# Patient Record
Sex: Male | Born: 1970 | Race: White | Hispanic: No | Marital: Married | Smoking: Never smoker
Health system: Southern US, Community
[De-identification: ages and names within clinical notes are randomized; demographics above are authoritative.]

## PROBLEM LIST (undated history)

## (undated) DIAGNOSIS — J40 Bronchitis, not specified as acute or chronic: Secondary | ICD-10-CM

## (undated) DIAGNOSIS — K219 Gastro-esophageal reflux disease without esophagitis: Secondary | ICD-10-CM

## (undated) HISTORY — DX: Gastro-esophageal reflux disease without esophagitis: K21.9

## (undated) HISTORY — PX: NO PAST SURGERIES: SHX2092

---

## 2003-10-29 ENCOUNTER — Emergency Department (HOSPITAL_COMMUNITY): Admission: EM | Admit: 2003-10-29 | Discharge: 2003-10-29 | Payer: Self-pay | Admitting: Emergency Medicine

## 2003-11-06 ENCOUNTER — Inpatient Hospital Stay (HOSPITAL_COMMUNITY): Admission: RE | Admit: 2003-11-06 | Discharge: 2003-11-07 | Payer: Self-pay | Admitting: Psychiatry

## 2010-12-07 ENCOUNTER — Emergency Department (HOSPITAL_COMMUNITY)
Admission: EM | Admit: 2010-12-07 | Discharge: 2010-12-07 | Disposition: A | Discharge status: Home / Self Care | Payer: Self-pay | Attending: Emergency Medicine | Admitting: Emergency Medicine

## 2010-12-07 DIAGNOSIS — M5430 Sciatica, unspecified side: Secondary | ICD-10-CM | POA: Insufficient documentation

## 2010-12-07 DIAGNOSIS — R209 Unspecified disturbances of skin sensation: Secondary | ICD-10-CM | POA: Insufficient documentation

## 2010-12-07 HISTORY — DX: Bronchitis, not specified as acute or chronic: J40

## 2010-12-07 MED ORDER — OXYCODONE-ACETAMINOPHEN 5-325 MG PO TABS
2.0000 | ORAL_TABLET | Freq: Once | ORAL | Status: AC
  Administered 2010-12-07: 2 via ORAL
  Filled 2010-12-07: qty 2

## 2010-12-07 MED ORDER — OXYCODONE-ACETAMINOPHEN 5-325 MG PO TABS: 1.0000 | ORAL_TABLET | ORAL | Status: AC | PRN

## 2010-12-07 MED ORDER — PREDNISONE 10 MG PO TABS: ORAL_TABLET | ORAL | Status: AC

## 2010-12-07 MED ORDER — CYCLOBENZAPRINE HCL 10 MG PO TABS: 10.0000 mg | ORAL_TABLET | Freq: Three times a day (TID) | ORAL | Status: AC | PRN

## 2010-12-07 NOTE — ED Provider Notes (Signed)
History     CSN: [id number] Arrival date & time: 12/07/2010  5:42 PM  Chief Complaint  Patient presents with  . Back Pain  . Back Injury   HPI Comments: Patient c/o low back pain and intermittent numbness and tingling into his right leg with excessive standing, that improves with rest.  States the sx's began after he was lifting heavy furniture several days ago.  He denies incontinence of feces or urine, abd pain, perineal numbness or weakness of his legs.   Patient is a 40 y.o. male presenting with back pain. The history is provided by the patient.  Back Pain  This is a new problem. The current episode started more than 2 days ago. The problem occurs constantly. The problem has not changed since onset.The pain is associated with lifting heavy objects. The pain is present in the lumbar spine. The quality of the pain is described as stabbing and aching. The pain radiates to the right thigh. The pain is moderate. The symptoms are aggravated by bending, twisting and certain positions. The pain is the same all the time. Associated symptoms include numbness, leg pain and tingling. Pertinent negatives include no chest pain, no fever, no abdominal pain, no abdominal swelling, no bowel incontinence, no bladder incontinence, no dysuria, no pelvic pain and no weakness. He has tried nothing for the symptoms.    Past Medical History  Diagnosis Date  . Bronchitis     History reviewed. No pertinent past surgical history.  History reviewed. No pertinent family history.  History  Substance Use Topics  . Smoking status: Never Smoker   . Smokeless tobacco: Not on file  . Alcohol Use: No      Review of Systems  Constitutional: Negative for fever.  HENT: Negative for neck pain and neck stiffness.   Cardiovascular: Negative for chest pain.  Gastrointestinal: Negative for abdominal pain and bowel incontinence.  Genitourinary: Negative for bladder incontinence, dysuria and pelvic pain.    Musculoskeletal: Positive for back pain. Negative for joint swelling.  Skin: Negative.   Neurological: Positive for tingling and numbness. Negative for weakness.  All other systems reviewed and are negative.    Physical Exam  BP 146/97  Pulse 110  Temp(Src) 98.6 F (37 C) (Oral)  Resp 20  Ht 5\' 10"  (1.778 m)  Wt 245 lb (111.131 kg)  BMI 35.15 kg/m2  SpO2 99%  Physical Exam  Nursing note and vitals reviewed. Constitutional: He is oriented to person, place, and time. He appears well-developed and well-nourished. No distress.  HENT:  Head: Normocephalic and atraumatic.  Mouth/Throat: Oropharynx is clear and moist.  Neck: Normal range of motion. Neck supple.  Cardiovascular: Normal rate, regular rhythm and normal heart sounds.   Abdominal: Soft. There is no tenderness.  Musculoskeletal: He exhibits tenderness. He exhibits no edema.       Lumbar back: He exhibits tenderness and bony tenderness. He exhibits no edema, no deformity and normal pulse.  Lymphadenopathy:    He has no cervical adenopathy.  Neurological: He is alert and oriented to person, place, and time. He has normal strength. No cranial nerve deficit or sensory deficit. He exhibits normal muscle tone. Coordination normal.  Reflex Scores:      Patellar reflexes are 2+ on the right side and 2+ on the left side.      Achilles reflexes are 2+ on the right side and 2+ on the left side. Skin: Skin is warm and dry.    ED Course  Procedures  MDM   1820  ttp of the right Si joint space and lumbar paraspinal muscles.  No focal neuro deficits.  DTR's nml.  Pt is ambulatory with slightly antalgic gait.  Pt agrees to close f/u with PMD.  Requests referral.  Likely sciatica.   I have reviewed the pt's vitals and nursing notes.     Patient / Family / Caregiver understand and agree with initial ED impression and plan with expectations set for ED visit.   The patient appears reasonably screened and/or stabilized for  discharge and I doubt any other medical condition or other [facility] requiring further screening, evaluation, or treatment in the ED at this time prior to discharge.   OUTPATIENT MEDICATIONS PRESCRIBED FROM THE ED:  Patient's Medications  New Prescriptions   CYCLOBENZAPRINE (FLEXERIL) 10 MG TABLET    Take 1 tablet (10 mg total) by mouth 3 (three) times daily as needed for muscle spasms.   OXYCODONE-ACETAMINOPHEN (PERCOCET) 5-325 MG PER TABLET    Take 1 tablet by mouth every 4 (four) hours as needed for pain.   PREDNISONE (DELTASONE) 10 MG TABLET    Take 6 tablets day one, 5 tablets day two, 4 tablets day three, 3 tablets day four, 2 tablets day five, then 1 tablet day six  Previous Medications   IBUPROFEN (ADVIL,MOTRIN) 200 MG TABLET    Take 400-600 mg by mouth 4 (four) times daily as needed. For pain   Modified Medications   No medications on file  Discontinued Medications   No medications on file         Marqueze Ramcharan L. [place], [place] 12/15/10 1238

## 2010-12-07 NOTE — ED Notes (Signed)
Pt reports hurting his back while lifting some furniture last Tuesday.  Pt reports pain in the mid-right side of his back.  Pt reports his rt leg going numb after standing for a few mins.

## 2010-12-07 NOTE — ED Notes (Signed)
MD at bedside., PA in prior to RN see PA assessment

## 2010-12-22 NOTE — ED Provider Notes (Signed)
Medical screening examination/treatment/procedure(s) were performed by non-physician practitioner and as supervising physician I was immediately available for consultation/collaboration.   Juliet Rude. Rubin Payor, MD 12/22/10 [phone]

## 2011-09-05 ENCOUNTER — Encounter (HOSPITAL_COMMUNITY): Payer: Self-pay | Admitting: *Deleted

## 2011-09-05 ENCOUNTER — Emergency Department (HOSPITAL_COMMUNITY)
Admission: EM | Admit: 2011-09-05 | Discharge: 2011-09-05 | Disposition: A | Discharge status: Home / Self Care | Payer: Self-pay | Attending: Emergency Medicine | Admitting: Emergency Medicine

## 2011-09-05 DIAGNOSIS — H6691 Otitis media, unspecified, right ear: Secondary | ICD-10-CM

## 2011-09-05 DIAGNOSIS — H6690 Otitis media, unspecified, unspecified ear: Secondary | ICD-10-CM | POA: Insufficient documentation

## 2011-09-05 DIAGNOSIS — H9209 Otalgia, unspecified ear: Secondary | ICD-10-CM | POA: Insufficient documentation

## 2011-09-05 DIAGNOSIS — H6121 Impacted cerumen, right ear: Secondary | ICD-10-CM

## 2011-09-05 DIAGNOSIS — H6120 Impacted cerumen, unspecified ear: Secondary | ICD-10-CM | POA: Insufficient documentation

## 2011-09-05 MED ORDER — AMOXICILLIN 500 MG PO CAPS: 500.0000 mg | ORAL_CAPSULE | Freq: Three times a day (TID) | ORAL | Status: AC

## 2011-09-05 NOTE — ED Notes (Signed)
Right ear pain and stopped up x 3 wks.

## 2011-09-05 NOTE — ED Notes (Signed)
Pt states ear has been stopped up and hurting x 3 weeks. Pt states he has tried OTC ear wax removal and this causes pain to increase.

## 2011-09-05 NOTE — ED Provider Notes (Signed)
History     CSN: [id number]  Arrival date & time 09/05/11  0917   First MD Initiated Contact with Patient 09/05/11 1038      Chief Complaint  Patient presents with  . Otalgia    (Consider location/radiation/quality/duration/timing/severity/associated sxs/prior treatment) Patient is a 41 y.o. male presenting with ear pain. The history is provided by the patient. No language interpreter was used.  Otalgia This is a new problem. Episode onset: hearing loss R ear.  has been using wax removal kit for  nearly 3 weeks and is now sore.   There is pain in the right ear. The problem occurs constantly. The problem has been gradually worsening. There has been no fever. Associated symptoms include hearing loss. Pertinent negatives include no ear discharge, no rhinorrhea, no sore throat and no neck pain. His past medical history does not include chronic ear infection, hearing loss or tympanostomy tube.    Past Medical History  Diagnosis Date  . Bronchitis     History reviewed. No pertinent past surgical history.  No family history on file.  History  Substance Use Topics  . Smoking status: Never Smoker   . Smokeless tobacco: Not on file  . Alcohol Use: Yes     occasional      Review of Systems  Constitutional: Negative for fever.  HENT: Positive for hearing loss and ear pain. Negative for sore throat, rhinorrhea, neck pain and ear discharge.   All other systems reviewed and are negative.    Allergies  Review of patient's allergies indicates no known allergies.  Home Medications   Current Outpatient Rx  Name Route Sig Dispense Refill  . CARBAMIDE PEROXIDE 6.5 % OT SOLN Right Ear Place 5 drops into the right ear as needed. For ear pain    . IBUPROFEN 200 MG PO TABS Oral Take 400-600 mg by mouth 4 (four) times daily as needed. For pain     . AMOXICILLIN 500 MG PO CAPS Oral Take 1 capsule (500 mg total) by mouth 3 (three) times daily. 30 capsule 0    BP 151/90  Pulse 81   Temp(Src) 98.1 F (36.7 C) (Oral)  Resp 16  Ht 5\' 9"  (1.753 m)  Wt 240 lb (108.863 kg)  BMI 35.44 kg/m2  SpO2 99%  Physical Exam  Nursing note and vitals reviewed. Constitutional: He is oriented to person, place, and time. He appears well-developed and well-nourished.  HENT:  Head: Normocephalic and atraumatic.  Right Ear: External ear normal. Tympanic membrane is not injected, not perforated, not erythematous, not retracted and not bulging. No hemotympanum.  Left Ear: Hearing, tympanic membrane, external ear and ear canal normal.       Cerumen impaction evident in R ear.  RN irrigated several times with water.  i irrigated with peroxide and large chunk of wax removed.  Pt hearing better.  There does however appear to be a small amount of fluid behind the TM.  There is not TM redness or bulging.  Eyes: EOM are normal.  Neck: Normal range of motion.  Cardiovascular: Normal rate, regular rhythm, normal heart sounds and intact distal pulses.   Pulmonary/Chest: Effort normal and breath sounds normal. No respiratory distress.  Abdominal: Soft. He exhibits no distension. There is no tenderness.  Musculoskeletal: Normal range of motion.  Neurological: He is alert and oriented to person, place, and time.  Skin: Skin is warm and dry.  Psychiatric: He has a normal mood and affect. Judgment normal.    ED  Course  Procedures (including critical care time)  Labs Reviewed - No data to display No results found.   1. Cerumen impaction, right   2. Otitis media of right ear       MDM  rx amoxicillin 500 mg TID, 30 Stop ceruminex tx.   F/u with [facility]        Worthy Rancher, PA 09/05/11 1137

## 2011-09-05 NOTE — Discharge Instructions (Signed)
Otitis Media, Adult A middle ear infection is an infection in the space behind the eardrum. The medical name for this is "otitis media." It may happen after a common cold. It is caused by a germ that starts growing in that space. You may feel swollen glands in your neck on the side of the ear infection. HOME CARE INSTRUCTIONS   Take your medicine as directed until it is gone, even if you feel better after the first few days.   Only take over-the-counter or prescription medicines for pain, discomfort, or fever as directed by your caregiver.   Occasional use of a nasal decongestant a couple times per day may help with discomfort and help the eustachian tube to drain better.  Follow up with your caregiver in 10 to 14 days or as directed, to be certain that the infection has cleared. Not keeping the appointment could result in a chronic or permanent injury, pain, hearing loss and disability. If there is any problem keeping the appointment, you must call back to this facility for assistance. SEEK IMMEDIATE MEDICAL CARE IF:   You are not getting better in 2 to 3 days.   You have pain that is not controlled with medication.   You feel worse instead of better.   You cannot use the medication as directed.   You develop swelling, redness or pain around the ear or stiffness in your neck.  MAKE SURE YOU:   Understand these instructions.   Will watch your condition.   Will get help right away if you are not doing well or get worse.  Document Released: 01/08/2004 Document Revised: 03/24/2011 Document Reviewed: 11/09/2007 [facility] Patient Information 2012 [place], [place].Cerumen Impaction A cerumen impaction is when the wax in your ear forms a plug. This plug usually causes reduced hearing. Sometimes it also causes an earache or dizziness. Removing a cerumen impaction can be difficult and painful. The wax sticks to the ear canal. The canal is sensitive and bleeds easily. If you try to remove a heavy  wax buildup with a cotton tipped swab, you may push it in further. Irrigation with water, suction, and small ear curettes may be used to clear out the wax. If the impaction is fixed to the skin in the ear canal, ear drops may be needed for a few days to loosen the wax. People who build up a lot of wax frequently can use ear wax removal products available in your local drugstore. SEEK MEDICAL CARE IF:  You develop an earache, increased hearing loss, or marked dizziness. Document Released: 05/12/2004 Document Revised: 03/24/2011 Document Reviewed: 07/02/2009 [facility] Patient Information 2012 [place], [place].   You had a cerumen impaction.  To cover the possibility that you have a middle ear infection i have prescribed amoxicillin.  Follow up with the ENT referral as needed.

## 2011-09-07 NOTE — ED Provider Notes (Signed)
Medical screening examination/treatment/procedure(s) were performed by non-physician practitioner and as supervising physician I was immediately available for consultation/collaboration.  Donnetta Hutching, MD 09/07/11 [phone]

## 2013-10-30 ENCOUNTER — Emergency Department (HOSPITAL_COMMUNITY)
Admission: EM | Admit: 2013-10-30 | Discharge: 2013-10-30 | Disposition: A | Discharge status: Home / Self Care | Payer: Private Health Insurance - Indemnity | Attending: Emergency Medicine | Admitting: Emergency Medicine

## 2013-10-30 ENCOUNTER — Emergency Department (HOSPITAL_COMMUNITY): Payer: Private Health Insurance - Indemnity

## 2013-10-30 ENCOUNTER — Encounter (HOSPITAL_COMMUNITY): Payer: Self-pay | Admitting: Emergency Medicine

## 2013-10-30 DIAGNOSIS — Z791 Long term (current) use of non-steroidal anti-inflammatories (NSAID): Secondary | ICD-10-CM | POA: Insufficient documentation

## 2013-10-30 DIAGNOSIS — Z8709 Personal history of other diseases of the respiratory system: Secondary | ICD-10-CM | POA: Insufficient documentation

## 2013-10-30 DIAGNOSIS — M792 Neuralgia and neuritis, unspecified: Secondary | ICD-10-CM

## 2013-10-30 DIAGNOSIS — M5412 Radiculopathy, cervical region: Secondary | ICD-10-CM | POA: Insufficient documentation

## 2013-10-30 DIAGNOSIS — M542 Cervicalgia: Secondary | ICD-10-CM

## 2013-10-30 MED ORDER — DIAZEPAM 5 MG PO TABS
5.0000 mg | ORAL_TABLET | Freq: Once | ORAL | Status: AC
  Administered 2013-10-30: 5 mg via ORAL
  Filled 2013-10-30: qty 1

## 2013-10-30 MED ORDER — OXYCODONE-ACETAMINOPHEN 5-325 MG PO TABS: 1.0000 | ORAL_TABLET | ORAL | Status: DC | PRN

## 2013-10-30 NOTE — ED Provider Notes (Signed)
CSN: [id number]     Arrival date & time 10/30/13  1910 History   First MD Initiated Contact with Patient 10/30/13 2033     Chief Complaint  Patient presents with  . Neck Pain     (Consider location/radiation/quality/duration/timing/severity/associated sxs/prior Treatment) The history is provided by the patient.   Rodderick Holtzer Starzyk is a 43 y.o. male who presents to the ED with neck pain. The pain started about 2 weeks ago when he turned his head and had immediate pain. He thought he pulled a muscle but the symptoms have gotten worse since then. The pain increases with any movement. He has taken ibuprofen without relief. He has tried heat and muscle cream. Now he is concerned due to the pain radiating down his left arm. He has been unable to seep at night due to the pain.   Past Medical History  Diagnosis Date  . Bronchitis    History reviewed. No pertinent past surgical history. No family history on file. History  Substance Use Topics  . Smoking status: Never Smoker   . Smokeless tobacco: Not on file  . Alcohol Use: Yes     Comment: occasional    Review of Systems Negative except as stated in HPI    Allergies  Review of patient's allergies indicates no known allergies.  Home Medications   Prior to Admission medications   Medication Sig Start Date End Date Taking? Authorizing Provider  carbamide peroxide (DEBROX) 6.5 % otic solution Place 5 drops into the right ear as needed. For ear pain    Historical Provider, MD  ibuprofen (ADVIL,MOTRIN) 200 MG tablet Take 400-600 mg by mouth 4 (four) times daily as needed. For pain     Historical Provider, MD   BP 168/92  Pulse 86  Temp(Src) 99 F (37.2 C) (Oral)  Resp 18  Ht 5\' 10"  (1.778 m)  Wt 250 lb (113.399 kg)  BMI 35.87 kg/m2  SpO2 99% Physical Exam  Nursing note and vitals reviewed. Constitutional: He is oriented to person, place, and time. He appears well-developed and well-nourished. No distress.  HENT:  Head:  Normocephalic and atraumatic.  Eyes: EOM are normal. Pupils are equal, round, and reactive to light.  Neck: Normal range of motion. Neck supple.  Cardiovascular: Normal rate and regular rhythm.   Pulmonary/Chest: Effort normal. No respiratory distress.  Abdominal: Soft. Bowel sounds are normal. There is no tenderness.  Musculoskeletal:       Cervical back: He exhibits decreased range of motion (due to pain), tenderness and spasm. He exhibits normal pulse.  Radial pulses equal, grips equal, adequate circulation.   Neurological: He is alert and oriented to person, place, and time. He has normal strength. No cranial nerve deficit or sensory deficit.  Reflex Scores:      Bicep reflexes are 2+ on the right side and 2+ on the left side.      Brachioradialis reflexes are 2+ on the right side and 2+ on the left side.      Patellar reflexes are 2+ on the right side and 2+ on the left side.      Achilles reflexes are 2+ on the right side and 2+ on the left side. Skin: Skin is warm and dry.  Psychiatric: He has a normal mood and affect. His behavior is normal.    ED Course  Procedures Ct Cervical Spine Wo Contrast  10/30/2013   CLINICAL DATA:  Left-sided neck and arm pain for 2 weeks with no trauma  EXAM: CT CERVICAL SPINE WITHOUT CONTRAST  TECHNIQUE: Multidetector CT imaging of the cervical spine was performed without intravenous contrast. Multiplanar CT image reconstructions were also generated.  COMPARISON:  None.  FINDINGS: Minimal C5-6 degenerative disc disease. Mildly reversed lordosis upper cervical spine likely not of acute origin. No prevertebral soft tissue swelling. Normal anterior-posterior alignment. No fracture or other focal osseous abnormality. No paraspinous soft tissue abnormality. Body habitus creates beam attenuation artifact involving the lower half of the cervical spine.  IMPRESSION: No acute findings. If there is concern for neural impingement or disc bulge, consider MRI.    Electronically Signed   By: Skipper Cliche M.D.   On: 10/30/2013 21:53    MDM  43 y.o. male with cervical spine pain that radiates to the left arm x 2 weeks. Symptoms have worsened and he is now unable to sleep due to the pain. Discussed with Dr. Lacinda Axon and MR of C spine ordered for tomorrow. I discussed with the patient clinical and CT findings and plan of care and he agrees with plan. Stable for discharge.    Medication List    TAKE these medications       oxyCODONE-acetaminophen 5-325 MG per tablet  Commonly known as:  PERCOCET/ROXICET  Take 1-2 tablets by mouth every 4 (four) hours as needed for moderate pain or severe pain.      ASK your doctor about these medications       ibuprofen 200 MG tablet  Commonly known as:  ADVIL,MOTRIN  Take 400-600 mg by mouth 4 (four) times daily as needed. For pain          Ashley Murrain, NP 10/30/13 2223

## 2013-10-30 NOTE — Discharge Instructions (Signed)
Your CT scan did not show any abnormality; however, because of the pain that radiates from your neck to your arm and causes numbness and tingling in your fingers we are ordering an MRI of your cervical spine.

## 2013-10-30 NOTE — ED Notes (Signed)
Patient states he turned his head and began having neck pain about 2 weeks ago; patient states pain is not getting any better.  Patient states pain radiates down his left arm.

## 2013-10-31 ENCOUNTER — Ambulatory Visit (HOSPITAL_COMMUNITY): Admit: 2013-10-31 | Payer: PRIVATE HEALTH INSURANCE

## 2013-10-31 ENCOUNTER — Ambulatory Visit (HOSPITAL_COMMUNITY): Payer: Managed Care, Other (non HMO) | Attending: Emergency Medicine

## 2013-10-31 NOTE — ED Provider Notes (Signed)
Medical screening examination/treatment/procedure(s) were performed by non-physician practitioner and as supervising physician I was immediately available for consultation/collaboration.   EKG Interpretation None       Nat Christen, MD 10/31/13 1914

## 2013-11-01 ENCOUNTER — Other Ambulatory Visit (HOSPITAL_COMMUNITY): Payer: PRIVATE HEALTH INSURANCE

## 2013-11-04 MED FILL — Oxycodone w/ Acetaminophen Tab 5-325 MG: ORAL | Qty: 6 | Status: AC

## 2021-05-17 ENCOUNTER — Telehealth: Payer: Self-pay

## 2021-05-17 NOTE — Telephone Encounter (Signed)
Sounds appropriate. Ok to schedule, looks like there is availability in [place] clinic tomorrow.

## 2021-05-17 NOTE — Telephone Encounter (Signed)
OUD Intake:  Drug of choice: "Cocaine and heroin, and I've done meth, too."  How long: "Since I was 52 years old."  Other substances: "I've smoked marijuana."  Currently on suboxone: "Yes, I've been taking off the street because I didn't have insurance" (1 film daily)  Interested in treatment: "Yes"  Around users.: "No"  Social support: "Yes, my Pastor. My church has NA and AA meetings every Friday, and I've been trying to go to those."   Other: "I got out of prison in April of last year and I'm trying to stay straight, and do things the right way, I just need some help."  Will forward to OUD Attending Pool for consideration of OUD appt. Hubbard Hartshorn, BSN, RN-BC

## 2021-05-17 NOTE — Telephone Encounter (Signed)
Patient scheduled for first OUD appt tomorrow at [place]. He was given detailed instructions on where [facility] is located. He is very [employer] attorney.

## 2021-05-17 NOTE — Telephone Encounter (Signed)
Requesting an appt for new pt OUD, please call pt back @ 11:00 or 2:00pm that's when he take his lunch break.

## 2021-05-18 ENCOUNTER — Ambulatory Visit (INDEPENDENT_AMBULATORY_CARE_PROVIDER_SITE_OTHER): Payer: Self-pay | Admitting: Student

## 2021-05-18 ENCOUNTER — Other Ambulatory Visit: Payer: Self-pay

## 2021-05-18 VITALS — BP 124/66 | HR 71 | Temp 98.1°F | Ht 69.0 in | Wt 233.9 lb

## 2021-05-18 DIAGNOSIS — F32A Depression, unspecified: Secondary | ICD-10-CM

## 2021-05-18 DIAGNOSIS — M545 Low back pain, unspecified: Secondary | ICD-10-CM

## 2021-05-18 DIAGNOSIS — G8929 Other chronic pain: Secondary | ICD-10-CM

## 2021-05-18 DIAGNOSIS — F1110 Opioid abuse, uncomplicated: Secondary | ICD-10-CM

## 2021-05-18 DIAGNOSIS — F419 Anxiety disorder, unspecified: Secondary | ICD-10-CM

## 2021-05-18 DIAGNOSIS — F1190 Opioid use, unspecified, uncomplicated: Secondary | ICD-10-CM

## 2021-05-18 MED ORDER — DULOXETINE HCL 30 MG PO CPEP: 30.0000 mg | ORAL_CAPSULE | Freq: Every day | ORAL | 2 refills | Status: DC

## 2021-05-18 MED ORDER — BUPRENORPHINE HCL-NALOXONE HCL 8-2 MG SL FILM: 1.0000 | ORAL_FILM | Freq: Every day | SUBLINGUAL | 0 refills | Status: DC

## 2021-05-18 NOTE — Patient Instructions (Signed)
It was a pleasure seeing you in clinic. Today we discussed:   Opoid use Please start using Suboxone 8-2 mg 1/2 film twice a day  Back pain Please stop using gabapentin Start duloxetine 30 mg daily You can take ibuprofen as needed for pain on bad days, not to take more than twice a week  Please follow up in 2 weeks  If you have any questions or concerns, please call our clinic at [phone] between 9am-5pm and after hours call [phone] and ask for the internal medicine resident on call. If you feel you are having a medical emergency please call 911.   Thank you, we look forward to helping you remain healthy!

## 2021-05-18 NOTE — Progress Notes (Signed)
05/19/2021  Edward [name] presents for buprenorphine/naloxone intake visit.   I have reviewed EPIC data including labwork which was available.  I have reviewed outside records provided by patient if available.  The salient points were confirmed with the patient.    Review of substance use history (first use, substances used, any illicit purchases):   Patient reports he first started to use marijuana at age 51 while hanging out with friends Subsequently progressed to cocaine, methamphetamines when 60, and heroin in 85s which he used off an on. He reports main route was snorting, denies any history of IV drug use. He stopped using substances after going to prison in June 2021 and released [place] 2022. Since then he has start smoking mariajuana about 3-4 times a day. Has also started using illicit suboxone and gabapentin. Started suboxone as he was worried that he may relapse on heroin.  States he had many relapses after quitting prior to being imprisoned and was never success at quitting long term. Also has been using suboxone and gabapentin to help with chronic left sided back pain. Currently used about 1 film  of suboxone daily and gabapentin 2-3 times daily. He reports he used about 1/2 a film of suboxone this morning.  Mental Health History: depression and anxiety in the past treated with xanax, prozac and another medication but he cannot recall. Has been feeling down recently due to being unable to things due to leg and back pain.  Current counseling/behavioural health provider: [facility] at [place] in the early 2000s  This patient has Opioid Use Disorder by following DSM-V criteria: Keep those that apply.  - Opioids taken in larger amounts or over a longer period than intended - Persistent desire to cut down - Cravings to use opioids - Use resulting in a failure to fulfill major role obligation - Use despite knowledge of health problems caused by opioids - Tolerance - Withdrawal  Past  Medical History:  Diagnosis Date   Bronchitis     No current outpatient medications on file prior to visit.   No current facility-administered medications on file prior to visit.   Physical Exam  Vitals:   05/18/21 0846 05/18/21 0847  BP:  124/66  Pulse:  71  Temp:  98.1 F (36.7 C)  TempSrc:  Oral  SpO2:  99%  Weight: 233 lb 14.4 oz (106.1 kg)   Height: 5\' 9"  (1.753 m)    Physical Exam Constitutional:      Appearance: Normal appearance.  Cardiovascular:     Rate and Rhythm: Normal rate and regular rhythm.  Pulmonary:     Effort: Pulmonary effort is normal.     Breath sounds: Normal breath sounds.  Musculoskeletal:     Comments: TTP of the left lumbar back, lateral thigh and anterior superior lateral knee  Skin:    General: Skin is warm and dry.  Neurological:     Mental Status: He is alert.     Clinical Opiate Withdrawal Scale: bold applicable COWS scoring   - Resting HR:    - 0 for < 80   - 1 for 81 - 100   - 2 for 101 - 120   - 4 for > 120  - Sweating:   - 0 for no chills/flushing   - 1 for subjective chills/flushing   - 3 for beads of sweat on brow/face   - 4 for sweat streaming off of face  - Restlessness:    - 0 for able to  sit still   - 1 for subjective difficulty sitting still   - 3 for frequent shifting or extraneous movement   - 5 for unable to sit still for more than a few seconds  - Pupil size:    - 0 for pinpoint or normal   - 1 for possibly larger than normal   - 2 for moderately dilated   - 5 for only iris rim visible  - Bone/joint pain:    - 0 for not present   - 1 for mild diffuse discomfort   - 2 severe diffuse aching   - 4 for objectively rubbing joints/muscles and obviously in pain  - Runny nose/tearing:    - 0 for not present   - 1 for stuffy nose/moist eyes   - 2 for nose running/tearing   - 4 for nose constantly running or tears streaming down cheeks  - GI Upset:    - 0 for no GI symptoms   - 1 for stomach cramps   - 2  for nausea or loose stool   - 3 for vomiting or diarrhea   - 5 for multiple episodes of vomiting or diarrhea  - Tremor observation of outstretched hands:    - 0 for no tremor   - 1 for tremor can be felt but not observed   - 2 for slight tremor observable   - 4 for gross tremor or muscle twitching  - Yawning:    - 0 for no yawning   - 1 for yawning once or twice during assessment   - 2 for yawning three or more times during assessment   - 4 for yawning several times per minute  - Anxiety or irritability:    - 0 for none   - 1 for patient reports increasing irritability or anxiousness   - 2 for patient obviously irritable/anxious   - 4 for patient so irritable/anxious that assessment is difficult  - Gooseflesh:    - 0 for skin is smooth   - 3 for piloerection of skin can be felt or seen   - 5 for prominent piloerection  TOTAL: 2  Assessment/Plan:   Based on a review of the patient's medical history including substance use and mental health factors, and physical exam, Edward [name] is a suitable candidate for MAT with buprenorphine/naloxone.  UDS ordered this visit.    I have discussed HIV and Hepatitis C screening with this patient.    I will place orders for CMET for baseline LFT evaluation if needed.    Home Induction:   I have instructed the patient how to appropriately take this medication, including placing under the tongue with head relaxed for 10 minutes and allowing to dissolve without chewing or swallowing tab/film, and with nothing to eat or drink in the subsequent 15 minutes.  They have been told not to start taking the medication until they have significant signs of withdrawal and I have explained the concept of precipitated withdrawal with the patient.    Intervisit Care:  We discussed this medication must be kept in a safe place and away from children.   We will see the patient back in 1 week in clinic, with options for a sooner appointment based on patient and  provider preference.   Patient was encouraged to call the office and speak with the MD on call for any urgent concerns.    [name], MD 05/19/2021 7:17 AM

## 2021-05-19 ENCOUNTER — Encounter: Payer: Self-pay | Admitting: Student

## 2021-05-19 DIAGNOSIS — F1190 Opioid use, unspecified, uncomplicated: Secondary | ICD-10-CM | POA: Insufficient documentation

## 2021-05-19 DIAGNOSIS — F419 Anxiety disorder, unspecified: Secondary | ICD-10-CM | POA: Insufficient documentation

## 2021-05-19 DIAGNOSIS — G8929 Other chronic pain: Secondary | ICD-10-CM | POA: Insufficient documentation

## 2021-05-19 DIAGNOSIS — M545 Low back pain, unspecified: Secondary | ICD-10-CM | POA: Insufficient documentation

## 2021-05-19 LAB — CMP14 + ANION GAP
ALT: 11 IU/L (ref 0–44)
AST: 13 IU/L (ref 0–40)
Albumin/Globulin Ratio: 1.7 (ref 1.2–2.2)
Albumin: 4.4 g/dL (ref 4.0–5.0)
Alkaline Phosphatase: 96 IU/L (ref 44–121)
Anion Gap: 14 mmol/L (ref 10.0–18.0)
BUN/Creatinine Ratio: 15 (ref 9–20)
BUN: 15 mg/dL (ref 6–24)
Bilirubin Total: 0.3 mg/dL (ref 0.0–1.2)
CO2: 23 mmol/L (ref 20–29)
Calcium: 9 mg/dL (ref 8.7–10.2)
Chloride: 102 mmol/L (ref 96–106)
Creatinine, Ser: 0.97 mg/dL (ref 0.76–1.27)
Globulin, Total: 2.6 g/dL (ref 1.5–4.5)
Glucose: 101 mg/dL — ABNORMAL HIGH (ref 70–99)
Potassium: 4.6 mmol/L (ref 3.5–5.2)
Sodium: 139 mmol/L (ref 134–144)
Total Protein: 7 g/dL (ref 6.0–8.5)
eGFR: 96 mL/min/{1.73_m2} (ref >59)

## 2021-05-19 LAB — HIV ANTIBODY (ROUTINE TESTING W REFLEX): HIV Screen 4th Generation wRfx: NONREACTIVE

## 2021-05-19 LAB — HCV INTERPRETATION

## 2021-05-19 LAB — HCV AB W REFLEX TO QUANT PCR: HCV Ab: <0.1 s/co ratio (ref 0.0–0.9)

## 2021-05-19 NOTE — Assessment & Plan Note (Signed)
Reports history of anxiety and depression. Treated with xannax, prozac, and another medication be cannot recall at [facility] in the early 2000s. States depression and panic attacks have greatly improved since then. Lately has been feeling down due to his leg pain and concern it may start limiting his daily activities. We will start duloxetine 30 mg daily for the chronic leg and back pain and this may also improve his mood as week. Would repeat PHQ9 and GAD7 and discuss at next visit.

## 2021-05-19 NOTE — Addendum Note (Signed)
Addended by: Lalla Brothers T on: 05/19/2021 08:32 AM   Modules accepted: Level of Service

## 2021-05-19 NOTE — Progress Notes (Signed)
Internal Medicine Clinic Attending  I saw and evaluated the patient.  I personally confirmed the key portions of the history and exam documented by Dr. Liang and I reviewed pertinent patient test results.  The assessment, diagnosis, and plan were formulated together and I agree with the documentation in the resident's note.  

## 2021-05-19 NOTE — Assessment & Plan Note (Signed)
Patient presents with history of heroin use off and on since his 81s. Last use was June 2021 after being incarcerated. Has been using ilicit suboxone for about 9 months due to concerns of relapsing on heroin. Denies history of of IV drug use. States he tested negative for HIV and hepC when incarcerated. Likely has mild to moderate OUD. Currently used suboxone 1/2 film twice daily. We reviewed and sign contract today.  Start suboxone 8-2mg  1/2 film twice daily Check CMP, HIV, Hep C Follow up in 2 weeks

## 2021-05-19 NOTE — Assessment & Plan Note (Signed)
Patient reports chronic left lower back pain after stumbling on a sidewalk curb in June 2022. Has been taking illicit gabapentin and suboxone for this. States pain radiates to the side of left leg to the level of the knee states leg intermittently with pin and needle sensation. No radiation to the foot. Denies fever, chills, weight loss, bowel or bladder incontinence. Pain is worse with activity, such as standing for long period at work. He works as a Curator. 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 Will start on duloxetine 30 mg daily. Discussed suboxone we are starting is not to treat his pain. He can take NSAID as needed on bad days not to use more than 2 days a week.

## 2021-05-24 ENCOUNTER — Emergency Department (HOSPITAL_COMMUNITY): Payer: 59

## 2021-05-24 ENCOUNTER — Encounter (HOSPITAL_COMMUNITY): Payer: Self-pay | Admitting: *Deleted

## 2021-05-24 ENCOUNTER — Other Ambulatory Visit: Payer: Self-pay

## 2021-05-24 ENCOUNTER — Emergency Department (HOSPITAL_COMMUNITY)
Admission: EM | Admit: 2021-05-24 | Discharge: 2021-05-24 | Disposition: A | Discharge status: Home / Self Care | Payer: 59 | Attending: Emergency Medicine | Admitting: Emergency Medicine

## 2021-05-24 ENCOUNTER — Telehealth: Payer: Self-pay | Admitting: Internal Medicine

## 2021-05-24 DIAGNOSIS — R7309 Other abnormal glucose: Secondary | ICD-10-CM | POA: Insufficient documentation

## 2021-05-24 DIAGNOSIS — N200 Calculus of kidney: Secondary | ICD-10-CM | POA: Diagnosis not present

## 2021-05-24 DIAGNOSIS — Z20822 Contact with and (suspected) exposure to covid-19: Secondary | ICD-10-CM | POA: Diagnosis not present

## 2021-05-24 DIAGNOSIS — M87052 Idiopathic aseptic necrosis of left femur: Secondary | ICD-10-CM | POA: Insufficient documentation

## 2021-05-24 DIAGNOSIS — K5790 Diverticulosis of intestine, part unspecified, without perforation or abscess without bleeding: Secondary | ICD-10-CM | POA: Insufficient documentation

## 2021-05-24 DIAGNOSIS — R1030 Lower abdominal pain, unspecified: Secondary | ICD-10-CM | POA: Insufficient documentation

## 2021-05-24 DIAGNOSIS — M87051 Idiopathic aseptic necrosis of right femur: Secondary | ICD-10-CM | POA: Insufficient documentation

## 2021-05-24 DIAGNOSIS — M87059 Idiopathic aseptic necrosis of unspecified femur: Secondary | ICD-10-CM

## 2021-05-24 DIAGNOSIS — R1110 Vomiting, unspecified: Secondary | ICD-10-CM | POA: Insufficient documentation

## 2021-05-24 LAB — URINALYSIS, ROUTINE W REFLEX MICROSCOPIC
Bilirubin Urine: NEGATIVE
Glucose, UA: NEGATIVE mg/dL
Ketones, ur: 40 mg/dL — AB
Leukocytes,Ua: NEGATIVE
Nitrite: NEGATIVE
Specific Gravity, Urine: 1.02 (ref 1.005–1.030)
pH: 7 (ref 5.0–8.0)

## 2021-05-24 LAB — COMPREHENSIVE METABOLIC PANEL
ALT: 13 U/L (ref 0–44)
AST: 11 U/L — ABNORMAL LOW (ref 15–41)
Albumin: 4.1 g/dL (ref 3.5–5.0)
Alkaline Phosphatase: 84 U/L (ref 38–126)
Anion gap: 10 (ref 5–15)
BUN: 15 mg/dL (ref 6–20)
CO2: 22 mmol/L (ref 22–32)
Calcium: 9.1 mg/dL (ref 8.9–10.3)
Chloride: 103 mmol/L (ref 98–111)
Creatinine, Ser: 0.89 mg/dL (ref 0.61–1.24)
GFR, Estimated: >60 mL/min (ref >60)
Glucose, Bld: 130 mg/dL — ABNORMAL HIGH (ref 70–99)
Potassium: 3.6 mmol/L (ref 3.5–5.1)
Sodium: 135 mmol/L (ref 135–145)
Total Bilirubin: 1.4 mg/dL — ABNORMAL HIGH (ref 0.3–1.2)
Total Protein: 8.1 g/dL (ref 6.5–8.1)

## 2021-05-24 LAB — RESP PANEL BY RT-PCR (FLU A&B, COVID) ARPGX2
Influenza A by PCR: NEGATIVE
Influenza B by PCR: NEGATIVE
SARS Coronavirus 2 by RT PCR: NEGATIVE

## 2021-05-24 LAB — CBC
HCT: 43.9 % (ref 39.0–52.0)
Hemoglobin: 14.9 g/dL (ref 13.0–17.0)
MCH: 27.2 pg (ref 26.0–34.0)
MCHC: 33.9 g/dL (ref 30.0–36.0)
MCV: 80.1 fL (ref 80.0–100.0)
Platelets: 339 10*3/uL (ref 150–400)
RBC: 5.48 MIL/uL (ref 4.22–5.81)
RDW: 13.3 % (ref 11.5–15.5)
WBC: 8.5 10*3/uL (ref 4.0–10.5)
nRBC: 0 % (ref 0.0–0.2)

## 2021-05-24 LAB — DIFFERENTIAL
Abs Immature Granulocytes: 0.03 10*3/uL (ref 0.00–0.07)
Basophils Absolute: 0 10*3/uL (ref 0.0–0.1)
Basophils Relative: 0 %
Eosinophils Absolute: 0 10*3/uL (ref 0.0–0.5)
Eosinophils Relative: 0 %
Immature Granulocytes: 0 %
Lymphocytes Relative: 23 %
Lymphs Abs: 1.9 10*3/uL (ref 0.7–4.0)
Monocytes Absolute: 0.4 10*3/uL (ref 0.1–1.0)
Monocytes Relative: 5 %
Neutro Abs: 6.2 10*3/uL (ref 1.7–7.7)
Neutrophils Relative %: 72 %

## 2021-05-24 LAB — URINALYSIS, MICROSCOPIC (REFLEX)
Bacteria, UA: NONE SEEN
Squamous Epithelial / LPF: NONE SEEN (ref 0–5)
WBC, UA: NONE SEEN WBC/hpf (ref 0–5)

## 2021-05-24 LAB — LIPASE, BLOOD: Lipase: 28 U/L (ref 11–51)

## 2021-05-24 MED ORDER — IOHEXOL 300 MG/ML  SOLN
100.0000 mL | Freq: Once | INTRAMUSCULAR | Status: AC | PRN
  Administered 2021-05-24: 100 mL via INTRAVENOUS

## 2021-05-24 MED ORDER — ONDANSETRON HCL 4 MG PO TABS: ORAL_TABLET | ORAL | 0 refills | Status: DC

## 2021-05-24 MED ORDER — ONDANSETRON HCL 4 MG/2ML IJ SOLN
4.0000 mg | Freq: Once | INTRAMUSCULAR | Status: AC
  Administered 2021-05-24: 4 mg via INTRAVENOUS
  Filled 2021-05-24: qty 2

## 2021-05-24 MED ORDER — PANTOPRAZOLE SODIUM 40 MG IV SOLR
40.0000 mg | Freq: Once | INTRAVENOUS | Status: AC
  Administered 2021-05-24: 40 mg via INTRAVENOUS
  Filled 2021-05-24: qty 40

## 2021-05-24 MED ORDER — PANTOPRAZOLE SODIUM 20 MG PO TBEC: 20.0000 mg | DELAYED_RELEASE_TABLET | Freq: Every day | ORAL | 1 refills | Status: DC

## 2021-05-24 MED ORDER — SODIUM CHLORIDE 0.9 % IV BOLUS
1000.0000 mL | Freq: Once | INTRAVENOUS | Status: AC
  Administered 2021-05-24: 1000 mL via INTRAVENOUS

## 2021-05-24 NOTE — Discharge Instructions (Addendum)
Follow-up with Dr. Aline Brochure for your avascular necrosis of both hips.  Follow-up with your family doctor or Dr. Wallis Mart for your abdominal discomfort with vomiting.

## 2021-05-24 NOTE — ED Triage Notes (Signed)
Pt c/o abdominal pain, vomiting x 2 days.

## 2021-05-24 NOTE — ED Provider Notes (Signed)
[facility] EMERGENCY DEPARTMENT Provider Note   CSN: [id number] Arrival date & time: 05/24/21  7893     History  Chief Complaint  Patient presents with   Emesis    Edward [name] is a 51 y.o. male.  Patient presents with epigastric discomfort and vomiting.  Patient has no past medical history  The history is provided by the patient and medical records. No language interpreter was used.  Emesis Severity:  Moderate Timing:  Constant Quality:  Bilious material Able to tolerate:  Liquids Progression:  Improving Chronicity:  New Recent urination:  Normal Context: not post-tussive   Relieved by:  Nothing Worsened by:  Nothing Ineffective treatments:  None tried Associated symptoms: no abdominal pain, no cough, no diarrhea and no headaches       Home Medications Prior to Admission medications   Medication Sig Start Date End Date Taking? Authorizing Provider  Buprenorphine HCl-Naloxone HCl (SUBOXONE) 8-2 MG FILM Place 1 Film under the tongue daily. 05/18/21  Yes Axel Filler, MD  DULoxetine (CYMBALTA) 30 MG capsule Take 1 capsule (30 mg total) by mouth daily. 05/18/21 08/16/21 Yes [name], MD  ondansetron [facility]) 4 MG tablet Use 1 every 6 hours as needed for vomiting or nausea 05/24/21  Yes Milton Ferguson, MD  pantoprazole (PROTONIX) 20 MG tablet Take 1 tablet (20 mg total) by mouth daily. 05/24/21  Yes Milton Ferguson, MD      Allergies    Patient has no known allergies.    Review of Systems   Review of Systems  Constitutional:  Negative for appetite change and fatigue.  HENT:  Negative for congestion, ear discharge and sinus pressure.   Eyes:  Negative for discharge.  Respiratory:  Negative for cough.   Cardiovascular:  Negative for chest pain.  Gastrointestinal:  Positive for vomiting. Negative for abdominal pain and diarrhea.  Genitourinary:  Negative for frequency and hematuria.  Musculoskeletal:  Negative for back pain.  Skin:  Negative for rash.   Neurological:  Negative for seizures and headaches.  Psychiatric/Behavioral:  Negative for hallucinations.    Physical Exam Updated Vital Signs BP (!) 150/93    Pulse 73    Temp 98.4 F (36.9 C) (Oral)    Resp 16    Ht 5\' 9"  (1.753 m)    Wt 105.7 kg    SpO2 99%    BMI 34.41 kg/m  Physical Exam Vitals and nursing note reviewed.  Constitutional:      Appearance: He is well-developed.  HENT:     Head: Normocephalic.     Nose: Nose normal.  Eyes:     General: No scleral icterus.    Conjunctiva/sclera: Conjunctivae normal.  Neck:     Thyroid: No thyromegaly.  Cardiovascular:     Rate and Rhythm: Normal rate and regular rhythm.     Heart sounds: No murmur heard.   No friction rub. No gallop.  Pulmonary:     Breath sounds: No stridor. No wheezing or rales.  Chest:     Chest wall: No tenderness.  Abdominal:     General: There is no distension.     Tenderness: There is abdominal tenderness. There is no rebound.  Musculoskeletal:        General: Normal range of motion.     Cervical back: Neck supple.  Lymphadenopathy:     Cervical: No cervical adenopathy.  Skin:    Findings: No erythema or rash.  Neurological:     Mental Status: He is alert  and oriented to person, place, and time.     Motor: No abnormal muscle tone.     Coordination: Coordination normal.  Psychiatric:        Behavior: Behavior normal.    ED Results / Procedures / Treatments   Labs (all labs ordered are listed, but only abnormal results are displayed) Labs Reviewed  COMPREHENSIVE METABOLIC PANEL - Abnormal; Notable for the following components:      Result Value   Glucose, Bld 130 (*)    AST 11 (*)    Total Bilirubin 1.4 (*)    All other components within normal limits  URINALYSIS, ROUTINE W REFLEX MICROSCOPIC - Abnormal; Notable for the following components:   Hgb urine dipstick TRACE (*)    Ketones, ur 40 (*)    Protein, ur TRACE (*)    All other components within normal limits  RESP PANEL BY  RT-PCR (FLU A&B, COVID) ARPGX2  LIPASE, BLOOD  CBC  DIFFERENTIAL  URINALYSIS, MICROSCOPIC (REFLEX)    EKG EKG Interpretation  Date/Time:  Monday May 24 2021 09:33:05 EST Ventricular Rate:  70 PR Interval:  147 QRS Duration: 93 QT Interval:  417 QTC Calculation: 450 R Axis:   61 Text Interpretation: Sinus rhythm Confirmed by Milton Ferguson [phone]) on 05/24/2021 10:55:10 AM  Radiology CT ABDOMEN PELVIS W CONTRAST  Result Date: 05/24/2021 CLINICAL DATA:  Generalized abdominal pain and vomiting for 2 days EXAM: CT ABDOMEN AND PELVIS WITH CONTRAST TECHNIQUE: Multidetector CT imaging of the abdomen and pelvis was performed using the standard protocol following bolus administration of intravenous contrast. RADIATION DOSE REDUCTION: This exam was performed according to the departmental dose-optimization program which includes automated exposure control, adjustment of the mA and/or kV according to patient size and/or use of iterative reconstruction technique. CONTRAST:  181mL OMNIPAQUE IOHEXOL 300 MG/ML  SOLN COMPARISON:  02/07/2018 FINDINGS: Lower chest: No acute abnormality. Hepatobiliary: No focal liver abnormality is seen. No gallstones, gallbladder wall thickening, or biliary dilatation. Pancreas: Unremarkable. No pancreatic ductal dilatation or surrounding inflammatory changes. Spleen: Normal in size without focal abnormality. Adrenals/Urinary Tract: Adrenal glands are unremarkable. Bilateral punctate nonobstructing renal calculi. No ureterolithiasis. No obstructive uropathy. Decompressed bladder. Stomach/Bowel: Stomach is within normal limits. No evidence of bowel wall thickening, distention, or inflammatory changes. Normal appendix. Diverticulosis without evidence of diverticulitis. Vascular/Lymphatic: No significant vascular findings are present. No enlarged abdominal or pelvic lymph nodes. Reproductive: Prostate is unremarkable. Other: No abdominal wall hernia or abnormality. No  abdominopelvic ascites. Musculoskeletal: No acute osseous abnormality. No aggressive osseous lesion. 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 IMPRESSION: 1. No acute intra-abdominal or pelvic pathology. 2. Bilateral punctate nonobstructing renal calculi. 3. Diverticulosis without evidence of diverticulitis. 4. 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 Electronically Signed   By: Kathreen Devoid M.D.   On: 05/24/2021 10:09    Procedures Procedures    Medications Ordered in ED Medications  sodium chloride 0.9 % bolus 1,000 mL (0 mLs Intravenous Stopped 05/24/21 0907)  ondansetron (ZOFRAN) injection 4 mg (4 mg Intravenous Given 05/24/21 0746)  pantoprazole (PROTONIX) injection 40 mg (40 mg Intravenous Given 05/24/21 0748)  iohexol (OMNIPAQUE) 300 MG/ML solution 100 mL (100 mLs Intravenous Contrast Given 05/24/21 4967)    ED Course/ Medical Decision Making/ A&P                           Medical Decision Making Amount and/or Complexity of Data Reviewed Labs:  ordered. Radiology: ordered. ECG/medicine tests: ordered.  Risk Prescription drug management.   Labs and CT unremarkable for abdominal problems.  Patient placed on Protonix and Zofran and referred to GI.  Patient also has avascular necrosis seen on CT scan and he is referred to orthopedics for that   This patient presents to the ED for concern of vomiting, this involves an extensive number of treatment options, and is a complaint that carries with it a high risk of complications and morbidity.  The differential diagnosis includes gallbladder disease, gastritis, appendicitis   Co morbidities that complicate the patient evaluation  History of substance abuse   Additional history obtained:  Additional history obtained from significant other External records from outside source obtained and reviewed including hospital record   Lab Tests:  I Ordered, and  personally interpreted labs.  The pertinent results include: CBC, chemistries, lipase which showed mild elevated glucose at 130   Imaging Studies ordered:  I ordered imaging studies including CT abdomen I independently visualized and interpreted imaging which showed abdomen negative, but avascular necrosis of both femoral heads I agree with the radiologist interpretation   Cardiac Monitoring:  The patient was maintained on a cardiac monitor.  I personally viewed and interpreted the cardiac monitored which showed an underlying rhythm of: Normal sinus rhythm   Medicines ordered and prescription drug management:  I ordered medication including Zofran for nausea and normal saline for dehydration Reevaluation of the patient after these medicines showed that the patient improved I have reviewed the patients home medicines and have made adjustments as needed   Test Considered:  CT chest   Critical Interventions:  IV medicines and bolus of normal saline   Consultations Obtained: No consulted  Problem List / ED Course:  Persistent vomiting and avascular necrosis of hip   Reevaluation:  After the interventions noted above, I reevaluated the patient and found that they have :improved   Social Determinants of Health:  History of substance abuse   Dispostion:  After consideration of the diagnostic results and the patients response to treatment, I feel that the patent would benefit from discharge home with referral to GI for the vomiting or follow-up with his family doctor for the vomiting and patient is placed on Protonix.  He also was referred to orthopedics for his avascular necrosis.         Final Clinical Impression(s) / ED Diagnoses Final diagnoses:  Lower abdominal pain  Avascular necrosis of bone of hip, unspecified laterality ([place])    Rx / DC Orders ED Discharge Orders          Ordered    pantoprazole (PROTONIX) 20 MG tablet  Daily        05/24/21  1108    ondansetron (ZOFRAN) 4 MG tablet        05/24/21 1108              Milton Ferguson, MD 05/25/21 [phone]

## 2021-05-24 NOTE — ED Notes (Signed)
Patient Alert and oriented to baseline. Stable and ambulatory to baseline. Patient verbalized understanding of the discharge instructions.  Patient belongings were taken by the patient.   

## 2021-05-24 NOTE — Telephone Encounter (Signed)
ER REFERRAL  

## 2021-05-25 ENCOUNTER — Encounter: Payer: Self-pay | Admitting: Internal Medicine

## 2021-06-01 ENCOUNTER — Ambulatory Visit (INDEPENDENT_AMBULATORY_CARE_PROVIDER_SITE_OTHER): Payer: 59 | Admitting: Student

## 2021-06-01 ENCOUNTER — Other Ambulatory Visit: Payer: Self-pay

## 2021-06-01 VITALS — BP 126/68 | HR 62 | Temp 98.2°F | Wt 234.0 lb

## 2021-06-01 DIAGNOSIS — F419 Anxiety disorder, unspecified: Secondary | ICD-10-CM

## 2021-06-01 DIAGNOSIS — M545 Low back pain, unspecified: Secondary | ICD-10-CM | POA: Diagnosis not present

## 2021-06-01 DIAGNOSIS — F1190 Opioid use, unspecified, uncomplicated: Secondary | ICD-10-CM | POA: Diagnosis not present

## 2021-06-01 DIAGNOSIS — F1110 Opioid abuse, uncomplicated: Secondary | ICD-10-CM

## 2021-06-01 DIAGNOSIS — G8929 Other chronic pain: Secondary | ICD-10-CM

## 2021-06-01 DIAGNOSIS — F32A Depression, unspecified: Secondary | ICD-10-CM

## 2021-06-01 MED ORDER — BUPRENORPHINE HCL-NALOXONE HCL 8-2 MG SL FILM: ORAL_FILM | SUBLINGUAL | 0 refills | Status: DC

## 2021-06-01 MED ORDER — DULOXETINE HCL 30 MG PO CPEP: 60.0000 mg | ORAL_CAPSULE | Freq: Every day | ORAL | 2 refills | Status: DC

## 2021-06-01 NOTE — Assessment & Plan Note (Signed)
PHQ-9 down to 8 today from 13 2 weeks ago.  Increasing her Cymbalta to 60 mg daily today.  We will continue to monitor his mood and consider referral to CBT in the future.

## 2021-06-01 NOTE — Assessment & Plan Note (Addendum)
Patient continues to have low back pain that is not controlled with the current dose of Suboxone.  Patient was found to have avascular necrosis of bilateral femoral heads on the recent ED visit.  Likely secondary to an injury.  He denies any chronic steroid use. -- Increase Cymbalta to 60 mg daily -- Follow-up with orthopedic surgery as scheduled on 2/20

## 2021-06-01 NOTE — Progress Notes (Signed)
° °  06/01/2021  Edward [name] presents for follow up of opioid use disorder I have reviewed the prior induction visit, follow up visits, and telephone encounters relevant to opiate use disorder (OUD) treatment.   Current daily dose: 8-2mg  1/2 film BID  Date of Induction: 05/19/21  Current follow up interval, in weeks: 2  The patient has been adherent with the buprenorphine for OUD contract.   Last UDS Result: N/A  HPI: 51 year old gentleman with a history of heroin use on and off since his 85s establish care without OUD clinic 2 weeks ago. Patient here for follow-up. Last week, he was evaluated at the ER for stomach pain.  No intra-abdominal pathology was found on CT scan however patient was found to have avascular necrosis of bilateral femoral heads.  He was discharged home with PPI and Zofran and referred to GI and orthopedic surgery.  He has an appointment with orthopedic surgery on 2/20 and with GI on 3/29.  Reports his pain has been well controlled on the current dose of Suboxone so he had to take some extra films.  He ran out of Suboxone on Saturday, 3 days before his appointment.  He denies using any opioids in the last 3 days.  He reports he has not had significant effect from the Cymbalta.  Exam:   Vitals:   06/01/21 0904  BP: 126/68  Pulse: 62  Temp: 98.2 F (36.8 C)  TempSrc: Oral  SpO2: 98%  Weight: 234 lb (106.1 kg)   General: Pleasant, well-appearing middle-age man.  NAD. HEENT: [place]/AT.  Discolored dentition but no missing teeth. CV: RRR. No murmurs, rubs, or gallops. No LE edema Pulmonary: Lungs CTAB. Normal effort. No wheezing or rales. Skin: Warm and dry. No obvious rash or lesions. Neuro: A&Ox3. Moves all extremities. Normal sensation. Psych: Normal mood and affect    Assessment/Plan:  See Problem Based Charting in the Encounters Tab    Lacinda Axon, MD  06/01/2021  8:51 AM

## 2021-06-01 NOTE — Patient Instructions (Signed)
Thank you, Mr.Riker E Pauli for allowing [place] to provide your care today. Today we discussed your OUD. We are increasing the dose of your suboxone to help you control the cravings. Please follow up in 2 weeks.     I have ordered the following labs for you:  Lab Orders         [facility]ox[facility] Select,+Antidepr,UR       I have ordered the following medication/changed the following medications:  Increase Suboxone to 8-2 mg 1/2 twice a day Increase Cymbalta to 60 mg daily  My Chart Access: [email or web address]  Please follow-up in 2 weeks  Please make sure to arrive 15 minutes prior to your next appointment. If you arrive late, you may be asked to reschedule.    We look forward to seeing you next time. Please call our clinic at [phone] if you have any questions or concerns. The best time to call is Monday-Friday from 9am-4pm, but there is someone available 24/7. If after hours or the weekend, call the main hospital number and ask for the Internal Medicine Resident On-Call. If you need medication refills, please notify your pharmacy one week in advance and they will send [place] a request.   Thank you for letting us take part in your care. Wishing you the best!  Lacinda Axon, MD 06/01/2021, 9:26 AM IM Resident, PGY-2 [facility] 41:10

## 2021-06-01 NOTE — Assessment & Plan Note (Addendum)
Patient here for follow-up. Reports his pain has not been controlled the past 2 weeks. His current Suboxone doses 1/2 film twice a day. However, patient has had 2 use extra films during this timeframe. He ran out of his Suboxone 3 days ago. He denies using any illicit drugs in the past 3 days except marijuana.  He was found to have avascular necrosis of bilateral hip during a recent ER visit.  He denies any history of prolonged steroid use.  Avascular necrosis likely due to previous hip injury.  Nontraumatic cause such as chronic alcohol use is also a possibility.  Patient's current Suboxone dose not adequate as he continues to have cravings and needing more than the prescribed dose.  Patient reports that the 3 1/2 films a day usually controls his cravings.  No tox screen on file. -- Increase Suboxone 8-2 mg to 1.5 films daily -- Follow-up tox screen -- 2-week follow-up

## 2021-06-03 NOTE — Progress Notes (Signed)
Internal Medicine Clinic Attending  Case discussed with Dr. Amponsah  At the time of the visit.  We reviewed the resident's history and exam and pertinent patient test results.  I agree with the assessment, diagnosis, and plan of care documented in the resident's note.  

## 2021-06-06 LAB — TOXASSURE SELECT,+ANTIDEPR,UR

## 2021-06-07 ENCOUNTER — Ambulatory Visit: Payer: 59 | Admitting: Orthopedic Surgery

## 2021-06-07 ENCOUNTER — Other Ambulatory Visit: Payer: Self-pay

## 2021-06-07 ENCOUNTER — Ambulatory Visit: Payer: 59

## 2021-06-07 ENCOUNTER — Encounter: Payer: Self-pay | Admitting: Orthopedic Surgery

## 2021-06-07 VITALS — BP 128/82 | HR 84 | Ht 69.0 in | Wt 230.8 lb

## 2021-06-07 DIAGNOSIS — M5410 Radiculopathy, site unspecified: Secondary | ICD-10-CM

## 2021-06-07 DIAGNOSIS — M545 Low back pain, unspecified: Secondary | ICD-10-CM

## 2021-06-07 DIAGNOSIS — M25552 Pain in left hip: Secondary | ICD-10-CM

## 2021-06-07 DIAGNOSIS — M87051 Idiopathic aseptic necrosis of right femur: Secondary | ICD-10-CM

## 2021-06-07 DIAGNOSIS — M87052 Idiopathic aseptic necrosis of left femur: Secondary | ICD-10-CM

## 2021-06-07 MED ORDER — GABAPENTIN 300 MG PO CAPS: 300.0000 mg | ORAL_CAPSULE | Freq: Three times a day (TID) | ORAL | 5 refills | Status: DC

## 2021-06-07 MED ORDER — PREDNISONE 10 MG (48) PO TBPK: ORAL_TABLET | Freq: Every day | ORAL | 0 refills | Status: DC

## 2021-06-07 NOTE — Progress Notes (Signed)
Chief Complaint  Patient presents with   Hip Pain    Bilateral/ patient had a CT/ DX with avascular necrosis bilateral    HPI: 51 year old male went to the emergency room for abdominal pain.  He had a CAT scan that showed AVN and they sent him here for evaluation of the AVN which was bilateral on the CT  His complaints are lower back pain radiating to the left leg with some groin pain intermittently with the groin and leg pain on the left  Strangely enough this started after he stepped backwards off of a curb.  He also noticed at that time that he began having erectile dysfunction.  He completely lost the ability to have an erection at that time but over the last several months some of that has come back but it is intermittent  He does report a history of degenerative disc disease lumbar spine  Past Medical History:  Diagnosis Date   Bronchitis     BP 128/82    Pulse 84    Ht 5\' 9"  (1.753 m)    Wt 230 lb 12.8 oz (104.7 kg)    BMI 34.08 kg/m    General appearance: Well-developed well-nourished no gross deformities  Cardiovascular normal pulse and perfusion normal color without edema  Neurologically no sensation loss or deficits or pathologic reflexes  Psychological: Awake alert and oriented x3 mood and affect normal  Skin no lacerations or ulcerations no nodularity no palpable masses, no erythema or nodularity  Musculoskeletal: The strength in his legs is normal at the ankle and knee  He does have tenderness in his lower back and left lumbar and buttock area with normal hip motion bilaterally  Imaging x-ray of the spine shows asymmetry in the coronal plane and on the lateral no evidence of spondylolysis or listhesis  Plain films of the pelvis show some sclerosis in both femoral heads with intact spherical femoral heads  A/P  Encounter Diagnoses  Name Primary?   Lumbar pain Yes   Radicular pain of left lower extremity    Pain in left hip    Avascular necrosis of bone of  left hip (HCC)    Avascular necrosis of bone of hip, right (HCC)     Recommend prednisone 12-day Dosepak gabapentin 300 mg 3 times a day  Follow-up with phone call for results  Meds ordered this encounter  Medications   predniSONE (STERAPRED UNI-PAK 48 TAB) 10 MG (48) TBPK tablet    Sig: Take by mouth daily.    Dispense:  48 tablet    Refill:  0   gabapentin (NEURONTIN) 300 MG capsule    Sig: Take 1 capsule (300 mg total) by mouth 3 (three) times daily.    Dispense:  90 capsule    Refill:  5

## 2021-06-07 NOTE — Patient Instructions (Signed)
While we are working on your approval for MRI please go ahead and call to schedule your appointment with [facility] within at least one (1) week.   Central Scheduling [phone]  

## 2021-06-15 ENCOUNTER — Other Ambulatory Visit: Payer: Self-pay

## 2021-06-15 ENCOUNTER — Ambulatory Visit (INDEPENDENT_AMBULATORY_CARE_PROVIDER_SITE_OTHER): Payer: 59 | Admitting: Student in an Organized Health Care Education/Training Program

## 2021-06-15 DIAGNOSIS — F1190 Opioid use, unspecified, uncomplicated: Secondary | ICD-10-CM

## 2021-06-15 DIAGNOSIS — M87059 Idiopathic aseptic necrosis of unspecified femur: Secondary | ICD-10-CM | POA: Insufficient documentation

## 2021-06-15 DIAGNOSIS — R1013 Epigastric pain: Secondary | ICD-10-CM

## 2021-06-15 DIAGNOSIS — R109 Unspecified abdominal pain: Secondary | ICD-10-CM | POA: Insufficient documentation

## 2021-06-15 MED ORDER — BUPRENORPHINE HCL-NALOXONE HCL 8-2 MG SL FILM: ORAL_FILM | SUBLINGUAL | 0 refills | Status: DC

## 2021-06-15 MED ORDER — SENNA 8.6 MG PO TABS: 1.0000 | ORAL_TABLET | Freq: Every day | ORAL | 3 refills | Status: DC

## 2021-06-15 NOTE — Progress Notes (Signed)
° °  06/15/2021  Edward [name] Edward [name] presents for follow up of opioid use disorder I have reviewed the prior induction visit, follow up visits, and telephone encounters relevant to opiate use disorder (OUD) treatment.   Current daily dose: Suboxone 12mg  daily  Date of Induction: January 2023, but using for months prior  Current follow up interval, in weeks: 4  The patient has been adherent with the buprenorphine for OUD contract.   Last UDS Result: appropriate  HPI: 51 year old person here for office based therapy of OUD. Edward [name] is doing well since being seen 2 weeks ago. Reports that his cravings have been well controlled on increased dose of suboxone. Reports good adherence and good technique. Denies any recent illness, no relapse. Edward [name] works a full time job, has a stable living situation, and has insurance to cover his medications. Edward [name] has struggled with abdominal pain for several months that bothers him on a nearly daily basis. This has been evaluated in the ED, Edward [name] has had some improvement with daily protonix. Reports only one emesis over last 2 weeks. Edward [name] has had about a 14 pound unintentional weight loss, current BMI is 32, Edward [name] has had to re-notch his belt due to weight loss.  Exam:   Vitals:   06/15/21 0843 06/15/21 0844  BP:  131/69  Pulse:  76  Temp:  98.1 F (36.7 C)  TempSrc:  Oral  SpO2:  99%  Weight: 220 lb 4.8 oz (99.9 kg)     Gen: well appearing, no distress Heart: RRR, 2/6 early systolic murmur at RUSB Lungs: clear throughout, no wheezing Abd: soft, non-tender, non-distended, no masses Ext: Warm, well perfused, no edema Joints: Ok range of motion of both hips on log-roll  Assessment/Plan:  See Problem Based Charting in the Encounters Tab    Axel Filler, MD  06/15/2021  9:18 AM

## 2021-06-15 NOTE — Assessment & Plan Note (Signed)
Generalized abdominal pain with reassuring CT scan and labs in the ED. It is associated with unintentional weightloss, has been somewhat improved with daily PPI. We will continue with Protonix daily. I am adding Senna daily as well because he does have some mild opioid-induced constipation. He has an appointment with [facility] in one month, I think next step in evaluation would be EGD to eval for ulcer disease.

## 2021-06-15 NOTE — Assessment & Plan Note (Signed)
Symptomatically doing well. The increase to 1.5 films daily has gone well, controlling his cravings and chronic pain well. Denies any relapses. His last toxassure was appropriate 2 weeks ago, no need for another today. I reviewed the database which was appropriate. Will continue with 1.5 films daily, we went over good SL administration today. I sent a one month supply to his pharmacy. Has good insurance coverage of this medicine. Follow up with [place] in 4 weeks.

## 2021-06-15 NOTE — Assessment & Plan Note (Signed)
Bilateral femoral head avascular necrosis seen incidentally on CT abdomen but matching his symptoms of hip and low back pain. He is established with an Ortho practice in [place] and planning for MRI of lumbar spine and both hips next week. This will help with surgical options. He is only 74 so likely want to delay hip replacement surgery as long as possible. We are medically managing with duloxetine 60mg  daily, he also uses gabapentin which is helpful at night. Chronic suboxone will help somewhat with pain and function as well.

## 2021-06-21 ENCOUNTER — Ambulatory Visit (HOSPITAL_COMMUNITY)
Admission: RE | Admit: 2021-06-21 | Discharge: 2021-06-21 | Disposition: A | Discharge status: Home / Self Care | Payer: 59 | Source: Ambulatory Visit | Attending: Orthopedic Surgery | Admitting: Orthopedic Surgery

## 2021-06-21 ENCOUNTER — Other Ambulatory Visit: Payer: Self-pay

## 2021-06-21 DIAGNOSIS — M5410 Radiculopathy, site unspecified: Secondary | ICD-10-CM | POA: Diagnosis present

## 2021-06-21 DIAGNOSIS — M87051 Idiopathic aseptic necrosis of right femur: Secondary | ICD-10-CM | POA: Insufficient documentation

## 2021-06-21 DIAGNOSIS — M87052 Idiopathic aseptic necrosis of left femur: Secondary | ICD-10-CM

## 2021-06-21 DIAGNOSIS — M545 Low back pain, unspecified: Secondary | ICD-10-CM | POA: Insufficient documentation

## 2021-06-21 DIAGNOSIS — M25552 Pain in left hip: Secondary | ICD-10-CM | POA: Insufficient documentation

## 2021-06-22 ENCOUNTER — Telehealth: Payer: Self-pay | Admitting: Orthopedic Surgery

## 2021-06-22 NOTE — Telephone Encounter (Signed)
Left message for Edward [name] ? ?MRI shows the hips do not need surgery ? ?Lumbar MRI shows the patient needs to be set up with a spine specialist and we need to make a referral ? ?He should be calling back to agreed to being referred to a spine specialist ? ?Refer him to [place] [facility] ?

## 2021-06-23 ENCOUNTER — Telehealth: Payer: Self-pay

## 2021-06-23 ENCOUNTER — Other Ambulatory Visit: Payer: Self-pay

## 2021-06-23 ENCOUNTER — Ambulatory Visit: Payer: 59 | Admitting: Internal Medicine

## 2021-06-23 DIAGNOSIS — M5410 Radiculopathy, site unspecified: Secondary | ICD-10-CM

## 2021-06-23 DIAGNOSIS — M545 Low back pain, unspecified: Secondary | ICD-10-CM

## 2021-06-23 NOTE — Telephone Encounter (Signed)
Patient left message on voicemail yesterday late afternoon wanting to speak to Dr. Aline Brochure or assistant in reference to back surgery. ? ?Please call him at [phone] ?

## 2021-06-23 NOTE — Telephone Encounter (Signed)
Spoke with patient and advised him of Dr. Ruthe Mannan note from yesterday. Patient needs to be referred to spine specialist, however, patient has [employer] and [facility] does not accept this insurance. Will refer to Dr. Lorin Mercy or Louanne Skye at [facility]. Patient is in agreement with treatment plan ?

## 2021-07-01 ENCOUNTER — Telehealth: Payer: Self-pay | Admitting: Orthopedic Surgery

## 2021-07-01 ENCOUNTER — Ambulatory Visit (INDEPENDENT_AMBULATORY_CARE_PROVIDER_SITE_OTHER): Payer: 59 | Admitting: Orthopaedic Surgery

## 2021-07-01 ENCOUNTER — Other Ambulatory Visit: Payer: Self-pay

## 2021-07-01 VITALS — BP 128/82 | HR 84 | Ht 69.0 in | Wt 230.0 lb

## 2021-07-01 DIAGNOSIS — M87052 Idiopathic aseptic necrosis of left femur: Secondary | ICD-10-CM | POA: Diagnosis not present

## 2021-07-01 NOTE — Progress Notes (Signed)
? ?Office Visit Note ?  ?Patient: Edward [name]           ?Date of Birth: 10/15/1970           ?MRN: [id number] ?Visit Date: 07/01/2021 ?             ?Requested by: Carole Civil, MD ?[address] ?[place],  [place] [address] ?PCP: Patient, No Pcp Per (Inactive) ? ? ?Assessment & Plan: ?Visit Diagnoses:  ?1. Avascular necrosis of left femoral head ([place])   ? ? ?Plan: Patient with symptomatic left hip avascular necrosis confirmed on MRI scan.  He also has avascular necrosis of the opposite right hip but is not symptomatic.  He is having difficulty walking and plan would be left total of arthroplasty direct anterior approach overnight observation.  We discussed surgical treatment and then weaning off narcotic pain medication.  Procedure discussed questions were elicited and answered he understands request to proceed.  Risks of surgery discussed, spinal anesthesia, use of a walker or crutches postoperatively.  Risks of fracture revision dislocation discussed.  Plan plan direct anterior approach to minimize dislocation risk in this active painter. ? ?Follow-Up Instructions: No follow-ups on file.  ? ?Orders:  ?No orders of the defined types were placed in this encounter. ? ?No orders of the defined types were placed in this encounter. ? ? ? ? Procedures: ?No procedures performed ? ? ?Clinical Data: ?No additional findings. ? ? ?Subjective: ?Chief Complaint  ?Patient presents with  ? Lower Back - Pain  ? ? ?HPI 51 year old male referred by Dr. Ples Specter for problems with painful vascular necrosis of the left hip and mild degenerative changes in the spine with mild neuroforaminal narrowing L3-4 and L4-5.  Patient's had left groin pain that radiates stops just above the knee.  He is here with his fianc?e and states he injured it last summer at a water park and had noted progressive symptoms since that time.  Patient works as a Curator at [employer].  He is on chronic Cymbalta Suboxone and also  Neurontin.  He denies any significant history of alcohol consumption but states he did drink a little bit when he was a lot younger.  MRI both right left hip were done on 06/21/2021 which shows bilateral hip avascular necrosis.  Currently is not having any right groin pain only left groin pain and thigh pain.  Pain occurs with ambulation and gets relief with sitting.  No neurogenic claudication no fever chills.  No history of crystal arthropathy.  Patient did receive '10mg'$   prednisone Dosepak for his hip with temporary relief.  Patient was placed on Suboxone 05/18/2021 is on the 8-2 mg film. ? ?Review of Systems positive history of opioid use disorder chronic low back pain anxiety depression and avascular necrosis with painful left hip. ? ? ?Objective: ?Vital Signs: BP 128/82   Pulse 84   Ht '5\' 9"'$  (1.753 m)   Wt 230 lb (104.3 kg)   BMI 33.97 kg/m?  ? ?Physical Exam ?Constitutional:   ?   Appearance: He is well-developed.  ?HENT:  ?   Head: Normocephalic and atraumatic.  ?   Right Ear: External ear normal.  ?   Left Ear: External ear normal.  ?Eyes:  ?   Pupils: Pupils are equal, round, and reactive to light.  ?Neck:  ?   Thyroid: No thyromegaly.  ?   Trachea: No tracheal deviation.  ?Cardiovascular:  ?   Rate and Rhythm: Normal rate.  ?  Pulmonary:  ?   Effort: Pulmonary effort is normal.  ?   Breath sounds: No wheezing.  ?Abdominal:  ?   General: Bowel sounds are normal.  ?   Palpations: Abdomen is soft.  ?Musculoskeletal:  ?   Cervical back: Neck supple.  ?Skin: ?   General: Skin is warm and dry.  ?   Capillary Refill: Capillary refill takes less than 2 seconds.  ?Neurological:  ?   Mental Status: He is alert and oriented to person, place, and time.  ?Psychiatric:     ?   Behavior: Behavior normal.     ?   Thought Content: Thought content normal.     ?   Judgment: Judgment normal.  ? ? ?Ortho Exam positive Trendelenburg on the left.  Reproduction of his pain with internal rotation 20 degrees left hip.  No pain with  internal/external rotation opposite right hip no hip flexion contracture distal pulses are 2+.  He has sharp pain with supine attempted left straight leg raising and points to the groin.  Minimal trochanteric bursal tenderness. ? ?Specialty Comments:  ?No specialty comments available. ? ?Imaging: ?Narrative & Impression  ?CLINICAL DATA:  Hip pain. Osteonecrosis suspected. Lower back pain ?and bilateral hip pain for over 9 months. ?  ?EXAM: ?MR OF THE RIGHT HIP WITHOUT CONTRAST ?  ?TECHNIQUE: ?Multiplanar, multisequence MR imaging was performed. No intravenous ?contrast was administered. ?  ?COMPARISON:  AP pelvis 06/07/2021, CT abdomen and pelvis 05/24/2021 ?  ?FINDINGS: ?Bones: As seen on prior CT, there is serpiginous predominantly ?sclerotic decreased T1 and decreased T2 signal with areas of patchy ?peripheral increased T2 signal likely healing granulation tissue ?within the anterior superior right-greater-than-left femoral heads. ?This is seen in a region measuring up to 2.3 x 2.4 x 0.8 cm in ?greatest transverse by AP by craniocaudal measurements on the right ?and 2.2 x 1.8 x 0.7 cm on the left. There is no surrounding marrow ?edema. This is consistent with early avascular necrosis. No ?overlying cortical collapse is seen. ?  ?Mild joint space narrowing and peripheral osteophytosis degenerative ?changes of the pubic symphysis with moderate right and mild left ?pubic body subchondral marrow edema. ?  ?Articular cartilage and labrum ?  ?Articular cartilage: Mild-to-moderate bilateral anterior superior ?femoral head and acetabular cartilage thinning. ?  ?Labrum: Minimal bilateral anterior superior acetabular degenerative ?attenuation. ?  ?Joint or bursal effusion ?  ?Joint effusion:  No joint effusion within either hip. ?  ?Bursae: No trochanteric bursitis on either side. ?  ?Muscles and tendons ?  ?Muscles and tendons: The origins of the bilateral rectus femoris ?tendons and the bilateral common hamstring tendon  origins are ?intact. The insertions of the bilateral gluteus minimus gluteus ?medius and iliopsoas tendons are intact. The rectus ?abdominis-adductor aponeuroses are intact. ?  ?Other findings ?  ?Miscellaneous: No significant abnormality is seen within the ?intrapelvic soft tissues. ?  ?IMPRESSION:: ?IMPRESSION: ?1. Mild-to-moderate right greater than left avascular necrosis of ?the anterior superior femoral heads. No overlying cortical collapse. ?No significant acute marrow edema. ?2. Mild pubic symphysis osteoarthritis with right-greater-than-left ?subchondral marrow edema suggesting this may represent a source of ?pain. ?3. Mild-to-moderate bilateral anterior superior femoral head and ?acetabular cartilage thinning. ?  ?  ?Electronically Signed ?  By: Yvonne Kendall M.D. ?  On: 06/21/2021 18:10  ? ? ? ?PMFS History: ?Patient Active Problem List  ? Diagnosis Date Noted  ? Abdominal pain 06/15/2021  ? Avascular necrosis of femoral head ([place]) 06/15/2021  ? Opioid  use disorder 05/19/2021  ? Chronic lower back pain 05/19/2021  ? Anxiety and depression 05/19/2021  ? ?Past Medical History:  ?Diagnosis Date  ? Bronchitis   ?  ?Family History  ?Problem Relation Age of Onset  ? Diabetes Mother   ? Heart disease Father   ? Prostate cancer Father   ?  ?No past surgical history on file. ?Social History  ? ?Occupational History  ? Not on file  ?Tobacco Use  ? Smoking status: Never  ? Smokeless tobacco: Not on file  ?Vaping Use  ? Vaping Use: Never used  ?Substance and Sexual Activity  ? Alcohol use: Not Currently  ? Drug use: Yes  ?  Frequency: 7.0 times per week  ?  Types: Marijuana  ? Sexual activity: Not on file  ? ? ? ? ? ? ?

## 2021-07-01 NOTE — Telephone Encounter (Signed)
Spoke with patient's significant other. (DPR) She stated that they cleared up the surgery part about who was doing the surgery. She wanted to let you know that Dr. Lorin Mercy stated he wasn't worried about the back, however, he does need a hip replacement. FYI ?

## 2021-07-01 NOTE — Telephone Encounter (Signed)
Call from patient/patient's designated contact [place] following referral ortho appointment-said they are still there at [facility] after seeing Dr Lorin Mercy; states Dr Lorin Mercy was asking if Dr Aline Brochure would be doing the surgery from back or from front - appears Dr Aline Brochure referred out as he is not doing this type of surgery? Please call patient. ?

## 2021-07-05 ENCOUNTER — Other Ambulatory Visit: Payer: Self-pay

## 2021-07-05 DIAGNOSIS — M87051 Idiopathic aseptic necrosis of right femur: Secondary | ICD-10-CM

## 2021-07-05 DIAGNOSIS — M87052 Idiopathic aseptic necrosis of left femur: Secondary | ICD-10-CM

## 2021-07-05 NOTE — Telephone Encounter (Signed)
Referral entered for Dr.Blackman at [facility] regarding hip surgery ?

## 2021-07-09 ENCOUNTER — Other Ambulatory Visit: Payer: Self-pay

## 2021-07-14 ENCOUNTER — Ambulatory Visit: Payer: 59 | Admitting: Internal Medicine

## 2021-07-14 ENCOUNTER — Encounter: Payer: Self-pay | Admitting: Internal Medicine

## 2021-07-20 ENCOUNTER — Other Ambulatory Visit: Payer: Self-pay

## 2021-07-20 ENCOUNTER — Encounter: Payer: Self-pay | Admitting: Student

## 2021-07-20 ENCOUNTER — Ambulatory Visit (INDEPENDENT_AMBULATORY_CARE_PROVIDER_SITE_OTHER): Payer: 59 | Admitting: Student

## 2021-07-20 DIAGNOSIS — F1190 Opioid use, unspecified, uncomplicated: Secondary | ICD-10-CM

## 2021-07-20 MED ORDER — BUPRENORPHINE HCL-NALOXONE HCL 8-2 MG SL FILM: ORAL_FILM | SUBLINGUAL | 0 refills | Status: DC

## 2021-07-20 NOTE — Progress Notes (Signed)
? ?  CC: OUD ? ?HPI: ? ?Jerrion Tabbert Cude presents for follow up of opioid use disorder I have reviewed the prior induction visit, follow up visits, and telephone encounters relevant to opiate use disorder (OUD) treatment.  ?  ?Current daily dose: Suboxone '12mg'$  daily ?  ?Date of Induction: January 2023, but using for months prior ?  ?Current follow up interval, in weeks: 4 ?  ?The patient has been adherent with the buprenorphine for OUD contract.  ?  ?Last UDS Result: appropriate ? ?Mr.Piers Baade Aylward is a 51 y.o. living with avascular necrosis of left femoral head who presents to clinic today for his OUD. ? ?Please see problem based charting for detail ? ?Past Medical History:  ?Diagnosis Date  ? Bronchitis   ? ?Review of Systems:  per HPI ? ?Physical Exam: ? ?Vitals:  ? 07/20/21 0855  ?BP: 130/75  ?Pulse: 65  ?Temp: 98.3 ?F (36.8 ?C)  ?TempSrc: Oral  ?SpO2: 96%  ?Weight: 228 lb (103.4 kg)  ? ?Physical Exam ?Constitutional:   ?   General: He is not in acute distress. ?HENT:  ?   Head: Normocephalic.  ?Eyes:  ?   General:     ?   Right eye: No discharge.     ?   Left eye: No discharge.  ?Cardiovascular:  ?   Rate and Rhythm: Normal rate and regular rhythm.  ?   Heart sounds: Normal heart sounds.  ?Pulmonary:  ?   Effort: Pulmonary effort is normal. No respiratory distress.  ?   Breath sounds: Normal breath sounds.  ?Musculoskeletal:     ?   General: Normal range of motion.  ?Skin: ?   General: Skin is warm.  ?Neurological:  ?   General: No focal deficit present.  ?   Mental Status: He is alert.  ?Psychiatric:     ?   Mood and Affect: Mood normal.  ?  ? ?Assessment & Plan:  ? ?See Encounters Tab for problem based charting. ? ?Patient discussed with Dr. Heber Oneida  ?

## 2021-07-20 NOTE — Assessment & Plan Note (Signed)
Patient currently doing well.  He is working as a Curator.  He will postpone his left hip surgery due to his upcoming wedding in June.  He will let us know when he schedule the surgery. ? ?Currently doing 1.5 film daily, in which he takes 1 film in the morning and half a film in the afternoon.  Knows the appropriate way to use sublingual film.  Denies any relapse.  Endorses mild craving but tolerable. ? ?-Refill Suboxone for 30-day supplies ?-Follow-up in 4 weeks ?

## 2021-07-20 NOTE — Patient Instructions (Signed)
Mr. Mossa, ? ?It was great seeing you in the clinic this morning.  I am glad you are doing well.  Congratulations on your upcoming marriage! ? ?Please continue Suboxone 1.5 film daily. ? ?Please let us know when you plan to schedule your hip surgery. ? ?I will see you back in 4 weeks. ? ?Take care, ? ?Dr. Alfonse Spruce ? ?

## 2021-07-21 NOTE — Progress Notes (Signed)
Surgical Instructions ? ? ? Your procedure is scheduled on Friday April 14th. ? Report to [facility] Main Entrance "A" at 8 A.M., then check in with the Admitting office. ? Call this number if you have problems the morning of surgery: ? [phone] ? ? If you have any questions prior to your surgery date call [phone]: Open Monday-Friday 8am-4pm ? ? ? Remember: ? Do not eat after midnight the night before your surgery ? ?You may drink clear liquids until 7am the morning of your surgery.   ?Clear liquids allowed are: Water, Non-Citrus Juices (without pulp), Carbonated Beverages, Clear Tea, Black Coffee ONLY (NO MILK, CREAM OR POWDERED CREAMER of any kind), and Gatorade ?  ? Take these medicines the morning of surgery with A SIP OF WATER: ?DULoxetine (CYMBALTA) 30 MG capsule ?gabapentin (NEURONTIN) 300 MG capsule ?pantoprazole (PROTONIX) 20 MG tablet ? ?Buprenorphine HCl-Naloxone HCl (SUBOXONE) 8-2 MG FILM - Please ask your perscriber for recommendation. ? ? ? ?As of today, STOP taking any Aspirin (unless otherwise instructed by your surgeon) Aleve, Naproxen, Ibuprofen, Motrin, Advil, Goody's, BC's, all herbal medications, fish oil, and all vitamins. ? ?         ?Do not wear jewelry  ?Do not wear lotions, powders, colognes, or deodorant. ?Do not shave 48 hours prior to surgery.  Men may shave face and neck. ?Do not bring valuables to the hospital. ?Do not wear nail polish, gel polish, artificial nails, or any other type of covering on natural nails (fingers and toes) ? ?[facility] is not responsible for any belongings or valuables. .  ? ?Do NOT Smoke (Tobacco/Vaping)  24 hours prior to your procedure ? ?If you use a CPAP at night, you may bring your mask for your overnight stay. ?  ?Contacts, glasses, hearing aids, dentures or partials may not be worn into surgery, please bring cases for these belongings ?  ?For patients admitted to the hospital, discharge time will be determined by your treatment team. ?   ?Patients discharged the day of surgery will not be allowed to drive home, and someone needs to stay with them for 24 hours. ? ? ?SURGICAL WAITING ROOM VISITATION ?Patients having surgery or a procedure in a hospital may have two support people. ?Children under the age of 42 must have an adult with them who is not the patient. ?They may stay in the waiting area during the procedure and may switch out with other visitors. If the patient needs to stay at the hospital during part of their recovery, the visitor guidelines for inpatient rooms apply. ? ?Please refer to the [facility] website for the visitor guidelines for Inpatients (after your surgery is over and you are in a regular room).  ? ? ? ? ? ?Special instructions:   ? ?Oral Hygiene is also important to reduce your risk of infection.  Remember - BRUSH YOUR TEETH THE MORNING OF SURGERY WITH YOUR REGULAR TOOTHPASTE ? ? ?[facility]- Preparing For Surgery ? ?Before surgery, you can play an important role. Because skin is not sterile, your skin needs to be as free of germs as possible. You can reduce the number of germs on your skin by washing with CHG (chlorahexidine gluconate) Soap before surgery.  CHG is an antiseptic cleaner which kills germs and bonds with the skin to continue killing germs even after washing.   ? ? ?Please do not use if you have an allergy to CHG or antibacterial soaps. If your skin becomes reddened/irritated stop using  the CHG.  ?Do not shave (including legs and underarms) for at least 48 hours prior to first CHG shower. It is OK to shave your face. ? ?Please follow these instructions carefully. ?  ? ? Shower the NIGHT BEFORE SURGERY and the MORNING OF SURGERY with CHG Soap.  ? If you chose to wash your hair, wash your hair first as usual with your normal shampoo. After you shampoo, rinse your hair and body thoroughly to remove the shampoo.  Then [employer] and genitals (private parts) with your normal soap and rinse thoroughly to remove  soap. ? ?After that Use CHG Soap as you would any other liquid soap. You can apply CHG directly to the skin and wash gently with a scrungie or a clean washcloth.  ? ?Apply the CHG Soap to your body ONLY FROM THE NECK DOWN.  Do not use on open wounds or open sores. Avoid contact with your eyes, ears, mouth and genitals (private parts). Wash Face and genitals (private parts)  with your normal soap.  ? ?Wash thoroughly, paying special attention to the area where your surgery will be performed. ? ?Thoroughly rinse your body with warm water from the neck down. ? ?DO NOT shower/wash with your normal soap after using and rinsing off the CHG Soap. ? ?Pat yourself dry with a CLEAN TOWEL. ? ?Wear CLEAN PAJAMAS to bed the night before surgery ? ?Place CLEAN SHEETS on your bed the night before your surgery ? ?DO NOT SLEEP WITH PETS. ? ? ?Day of Surgery: ? ?Take a shower with CHG soap. ?Wear Clean/Comfortable clothing the morning of surgery ?Do not apply any deodorants/lotions.   ?Remember to brush your teeth WITH YOUR REGULAR TOOTHPASTE. ? ? ? ?If you received a COVID test during your pre-op visit  it is requested that you wear a mask when out in public, stay away from anyone that may not be feeling well and notify your surgeon if you develop symptoms. If you have been in contact with anyone that has tested positive in the last 10 days please notify you surgeon. ? ?  ?Please read over the following fact sheets that you were given.  ? ?

## 2021-07-21 NOTE — Progress Notes (Signed)
Internal Medicine Clinic Attending  Case discussed with Dr. Nguyen  At the time of the visit.  We reviewed the resident's history and exam and pertinent patient test results.  I agree with the assessment, diagnosis, and plan of care documented in the resident's note. 

## 2021-07-22 ENCOUNTER — Inpatient Hospital Stay (HOSPITAL_COMMUNITY)
Admission: RE | Admit: 2021-07-22 | Discharge: 2021-07-22 | Disposition: A | Discharge status: Home / Self Care | Payer: 59 | Source: Ambulatory Visit

## 2021-07-22 ENCOUNTER — Encounter (HOSPITAL_COMMUNITY): Payer: Self-pay | Admitting: Physician Assistant

## 2021-07-22 ENCOUNTER — Ambulatory Visit: Payer: 59 | Admitting: Surgery

## 2021-07-22 NOTE — Progress Notes (Signed)
Surgical Instructions ? ? ? Your procedure is scheduled on Friday April 14th. ? Report to [facility] Main Entrance "A" at 8 A.M., then check in with the Admitting office. ? Call this number if you have problems the morning of surgery: ? [phone] ? ? If you have any questions prior to your surgery date call [phone]: Open Monday-Friday 8am-4pm ? ? ? Remember: ? Do not eat after midnight the night before your surgery ? ?You may drink clear liquids until 7am the morning of your surgery.   ?Clear liquids allowed are: Water, Non-Citrus Juices (without pulp), Carbonated Beverages, Clear Tea, Black Coffee ONLY (NO MILK, CREAM OR POWDERED CREAMER of any kind), and Gatorade ? ?Please complete your PRE-SURGERY ENSURE that was provided to you by 7:00 am the morning of surgery.  Please, if able, drink it in one setting. DO NOT SIP.  ?  ? Take these medicines the morning of surgery with A SIP OF WATER: ?DULoxetine (CYMBALTA) 30 MG capsule ?gabapentin (NEURONTIN) 300 MG capsule ?pantoprazole (PROTONIX) 20 MG tablet ? ?Buprenorphine HCl-Naloxone HCl (SUBOXONE) 8-2 MG FILM - Please ask your perscriber for recommendation. ? ? ? ?As of today, STOP taking any Aspirin (unless otherwise instructed by your surgeon) Aleve, Naproxen, Ibuprofen, Motrin, Advil, Goody's, BC's, all herbal medications, fish oil, and all vitamins. ? ?         ?Do not wear jewelry  ?Do not wear lotions, powders, colognes, or deodorant. ?Do not shave 48 hours prior to surgery.  Men may shave face and neck. ?Do not bring valuables to the hospital. ? ? ?[facility] is not responsible for any belongings or valuables. .  ? ?Do NOT Smoke (Tobacco/Vaping)  24 hours prior to your procedure ? ?If you use a CPAP at night, you may bring your mask for your overnight stay. ?  ?Contacts, glasses, hearing aids, dentures or partials may not be worn into surgery, please bring cases for these belongings ?  ?For patients admitted to the hospital, discharge time will be  determined by your treatment team. ?  ?Patients discharged the day of surgery will not be allowed to drive home, and someone needs to stay with them for 24 hours. ? ? ?SURGICAL WAITING ROOM VISITATION ?Patients having surgery or a procedure in a hospital may have two support people. ?Children under the age of 79 must have an adult with them who is not the patient. ?They may stay in the waiting area during the procedure and may switch out with other visitors. If the patient needs to stay at the hospital during part of their recovery, the visitor guidelines for inpatient rooms apply. ? ?Please refer to the [facility] website for the visitor guidelines for Inpatients (after your surgery is over and you are in a regular room).  ? ? ? ?Special instructions:   ? ?Oral Hygiene is also important to reduce your risk of infection.  Remember - BRUSH YOUR TEETH THE MORNING OF SURGERY WITH YOUR REGULAR TOOTHPASTE ? ? ?[facility]- Preparing For Surgery ? ?Before surgery, you can play an important role. Because skin is not sterile, your skin needs to be as free of germs as possible. You can reduce the number of germs on your skin by washing with CHG (chlorahexidine gluconate) Soap before surgery.  CHG is an antiseptic cleaner which kills germs and bonds with the skin to continue killing germs even after washing.   ? ? ?Please do not use if you have an allergy to CHG or  antibacterial soaps. If your skin becomes reddened/irritated stop using the CHG.  ?Do not shave (including legs and underarms) for at least 48 hours prior to first CHG shower. It is OK to shave your face. ? ?Please follow these instructions carefully. ?  ? ? Shower the NIGHT BEFORE SURGERY and the MORNING OF SURGERY with CHG Soap.  ? If you chose to wash your hair, wash your hair first as usual with your normal shampoo. After you shampoo, rinse your hair and body thoroughly to remove the shampoo.  Then [employer] and genitals (private parts) with your normal soap  and rinse thoroughly to remove soap. ? ?After that Use CHG Soap as you would any other liquid soap. You can apply CHG directly to the skin and wash gently with a scrungie or a clean washcloth.  ? ?Apply the CHG Soap to your body ONLY FROM THE NECK DOWN.  Do not use on open wounds or open sores. Avoid contact with your eyes, ears, mouth and genitals (private parts). Wash Face and genitals (private parts)  with your normal soap.  ? ?Wash thoroughly, paying special attention to the area where your surgery will be performed. ? ?Thoroughly rinse your body with warm water from the neck down. ? ?DO NOT shower/wash with your normal soap after using and rinsing off the CHG Soap. ? ?Pat yourself dry with a CLEAN TOWEL. ? ?Wear CLEAN PAJAMAS to bed the night before surgery ? ?Place CLEAN SHEETS on your bed the night before your surgery ? ?DO NOT SLEEP WITH PETS. ? ? ?Day of Surgery: ? ?Take a shower with CHG soap. ?Wear Clean/Comfortable clothing the morning of surgery ?Do not apply any deodorants/lotions.   ?Remember to brush your teeth WITH YOUR REGULAR TOOTHPASTE. ? ? ? ?If you received a COVID test during your pre-op visit  it is requested that you wear a mask when out in public, stay away from anyone that may not be feeling well and notify your surgeon if you develop symptoms. If you have been in contact with anyone that has tested positive in the last 10 days please notify you surgeon. ? ?  ?Please read over the following fact sheets that you were given.  ? ?

## 2021-07-24 ENCOUNTER — Other Ambulatory Visit: Payer: Self-pay | Admitting: Student

## 2021-07-25 LAB — TOXASSURE SELECT,+ANTIDEPR,UR

## 2021-07-26 ENCOUNTER — Telehealth: Payer: Self-pay

## 2021-07-26 NOTE — Telephone Encounter (Signed)
FYI - received call from [place], patient's significant other, stating he wants to cancel surgery for this coming Friday and wanted to reschedule at a later date.  Was scheduled for left THA for AVN. ?

## 2021-07-30 ENCOUNTER — Ambulatory Visit (HOSPITAL_COMMUNITY): Admission: RE | Admit: 2021-07-30 | Payer: 59 | Source: Home / Self Care | Admitting: Orthopaedic Surgery

## 2021-07-30 DIAGNOSIS — Z01818 Encounter for other preprocedural examination: Secondary | ICD-10-CM

## 2021-07-30 SURGERY — ARTHROPLASTY, HIP, TOTAL, ANTERIOR APPROACH
Anesthesia: Spinal | Site: Hip | Laterality: Left

## 2021-08-17 ENCOUNTER — Ambulatory Visit (INDEPENDENT_AMBULATORY_CARE_PROVIDER_SITE_OTHER): Payer: 59 | Admitting: Student

## 2021-08-17 ENCOUNTER — Other Ambulatory Visit: Payer: Self-pay

## 2021-08-17 DIAGNOSIS — F1190 Opioid use, unspecified, uncomplicated: Secondary | ICD-10-CM | POA: Diagnosis not present

## 2021-08-17 DIAGNOSIS — F419 Anxiety disorder, unspecified: Secondary | ICD-10-CM | POA: Diagnosis not present

## 2021-08-17 DIAGNOSIS — F32A Depression, unspecified: Secondary | ICD-10-CM

## 2021-08-17 MED ORDER — BUPRENORPHINE HCL-NALOXONE HCL 8-2 MG SL FILM
1.0000 | ORAL_FILM | Freq: Two times a day (BID) | SUBLINGUAL | 0 refills | Status: AC
Start: 2021-08-19 — End: 2021-09-18

## 2021-08-17 NOTE — Progress Notes (Signed)
? ?  08/17/2021 ? ?Edward [name] presents for follow up of opioid use disorder I have reviewed the prior induction visit, follow up visits, and telephone encounters relevant to opiate use disorder (OUD) treatment.  ? ?Current daily dose: Suboxone 8-2 mg 1.5 films daily ? ?Date of Induction: 05/18/2021 (with prior history of suboxone use) ? ?Current follow up interval, in weeks: 4 weeks ? ?The patient has been adherent with the buprenorphine for OUD contract.  ? ?Last UDS Result: 07/20/2021 appropriate   ? 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  ? ?Exam:  ? ?Vitals:  ? 08/17/21 0909  ?BP: (!) 149/86  ?Pulse: 60  ?Temp: 98.2 ?F (36.8 ?C)  ?TempSrc: Oral  ?SpO2: 97%  ?Weight: 227 lb 3.2 oz (103.1 kg)  ? ? ?Physical Exam ?Constitutional:   ?   Appearance: Normal appearance.  ?HENT:  ?   Head: Normocephalic and atraumatic.  ?Cardiovascular:  ?   Rate and Rhythm: Normal rate and regular rhythm.  ?Pulmonary:  ?   Effort: Pulmonary effort is normal.  ?   Breath sounds: Normal breath sounds. No wheezing or rhonchi.  ?Abdominal:  ?   General: Abdomen is flat. Bowel sounds are normal. There is no distension.  ?   Palpations: Abdomen is soft.  ?   Tenderness: There is no abdominal tenderness.  ?Musculoskeletal:  ?   Cervical back: Normal range of motion and neck supple.  ?Skin: ?   General: Skin is warm and dry.  ?Neurological:  ?   General: No focal deficit present.  ?   Mental Status: He is alert and oriented to person, place, and time.  ? ? ?Assessment/Plan:  See Problem Based Charting in the Encounters Tab ? ? ?[name], MD ? ?08/17/2021  9:43 AM ? ?Patient discussed with Dr. Evette Doffing ? ?

## 2021-08-17 NOTE — Assessment & Plan Note (Signed)
PHQ9 improved to 5 today. States he is still interested in meeting with Dr. Theodis Shove but did not get called for an appointment. Advised he make an appointment when he checks out today.  ?

## 2021-08-17 NOTE — Assessment & Plan Note (Addendum)
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 Currently looking for a new job. Feels that he will secure employment soon.  He plans to schedule surgery for his hip after his wedding in June. ? ?He reports adherence with 1.5 films a day. Denies any relapse. Discussed increasing dosage as he is having more cravings. Reports some financial strain but feels price of suboxone is manageable and would like to continue using films at this time.  ? ?- suboxone 8-2 mg films twice daily ?- follow up in 4 weeks  ?

## 2021-08-17 NOTE — Progress Notes (Signed)
Internal Medicine Clinic Attending ? ?Case discussed with Dr. Liang  At the time of the visit.  We reviewed the resident?s history and exam and pertinent patient test results.  I agree with the assessment, diagnosis, and plan of care documented in the resident?s note. ? ?

## 2021-08-17 NOTE — Patient Instructions (Addendum)
It was a pleasure seeing you in clinic. Today we discussed:  ? ?Opioid use ?I have refilled your prescription for suboxone ?Please increase to 1 film twice daily to help with craving ? ?Please make an appointment to see Dr. Theodis Shove when you check out today ? ?Follow up in 4 weeks ? ?If you have any questions or concerns, please call our clinic at [phone] between 9am-5pm and after hours call [phone] and ask for the internal medicine resident on call. If you feel you are having a medical emergency please call 911.  ? ?Thank you, we look forward to helping you remain healthy! ? ? ?

## 2021-09-01 ENCOUNTER — Ambulatory Visit: Payer: 59 | Admitting: Internal Medicine

## 2021-09-01 ENCOUNTER — Encounter: Payer: Self-pay | Admitting: Internal Medicine

## 2021-09-01 VITALS — BP 120/70 | HR 72 | Temp 97.9°F | Ht 69.0 in | Wt 218.0 lb

## 2021-09-01 DIAGNOSIS — R112 Nausea with vomiting, unspecified: Secondary | ICD-10-CM | POA: Diagnosis not present

## 2021-09-01 DIAGNOSIS — R1013 Epigastric pain: Secondary | ICD-10-CM | POA: Diagnosis not present

## 2021-09-01 DIAGNOSIS — F1290 Cannabis use, unspecified, uncomplicated: Secondary | ICD-10-CM | POA: Diagnosis not present

## 2021-09-01 DIAGNOSIS — K219 Gastro-esophageal reflux disease without esophagitis: Secondary | ICD-10-CM | POA: Diagnosis not present

## 2021-09-01 MED ORDER — ONDANSETRON HCL 4 MG PO TABS: 4.0000 mg | ORAL_TABLET | Freq: Three times a day (TID) | ORAL | 1 refills | Status: DC | PRN

## 2021-09-01 MED ORDER — PANTOPRAZOLE SODIUM 40 MG PO TBEC: 40.0000 mg | DELAYED_RELEASE_TABLET | Freq: Every day | ORAL | 5 refills | Status: DC

## 2021-09-01 NOTE — H&P (View-Only) (Signed)
Primary Care Physician:  Patient, No Pcp Per (Inactive) Primary Gastroenterologist:  Dr. Abbey Chatters  Chief Complaint  Patient presents with   New Patient (Initial Visit)    ER referral    HPI:   Edward [name] is a 51 y.o. male who presents to the clinic today as a new patient for ER follow-up visit.  Patient states for approximately 7 months off-and-on he has had recurrent episodes of epigastric abdominal pain followed by 2 to 3 days of nausea and vomiting.  These episodes are separated weeks at a time.  When they occur he will be in bed for multiple days.  Also notes profuse sweating as well as chills.  Symptoms not related with meals.  Denies any exposure to ticks.  Denies any symptoms associated with eating red meat or pork.  No dysphagia/odynophagia.  No melena hematochezia.  Intermittent ibuprofen use.  No previous upper endoscopy.    Does note acid reflux and heartburn as well.  Currently on Protonix 20 mg daily which is helped some.  No change in his bowels.  Denies any constipation or diarrhea.  No family history of colorectal malignancy.  No previous colonoscopy or any colon cancer modality.  Does note daily marijuana use.  Also chronically on Suboxone.  Past Medical History:  Diagnosis Date   Bronchitis    Patient denies any previous surgeries  Current Outpatient Medications  Medication Sig Dispense Refill   Buprenorphine HCl-Naloxone HCl (SUBOXONE) 8-2 MG FILM Place 1 Film under the tongue in the morning and at bedtime. 60 each 0   gabapentin (NEURONTIN) 300 MG capsule Take 1 capsule (300 mg total) by mouth 3 (three) times daily. 90 capsule 5   pantoprazole (PROTONIX) 20 MG tablet TAKE 1 TABLET(20 MG) BY MOUTH DAILY 30 tablet 1   senna (SENOKOT) 8.6 MG TABS tablet Take 1 tablet (8.6 mg total) by mouth daily. 90 tablet 3   DULoxetine (CYMBALTA) 30 MG capsule Take 2 capsules (60 mg total) by mouth daily. 60 capsule 2   No current facility-administered medications for this  visit.    Allergies as of 09/01/2021   (No Known Allergies)    Family History  Problem Relation Age of Onset   Diabetes Mother    Heart disease Father    Prostate cancer Father     Social History   Socioeconomic History   Marital status: Married    Spouse name: Not on file   Number of children: Not on file   Years of education: Not on file   Highest education level: Not on file  Occupational History   Not on file  Tobacco Use   Smoking status: Never   Smokeless tobacco: Not on file  Vaping Use   Vaping Use: Never used  Substance and Sexual Activity   Alcohol use: Not Currently   Drug use: Yes    Frequency: 7.0 times per week    Types: Marijuana   Sexual activity: Not on file  Other Topics Concern   Not on file  Social History Narrative   Not on file   Social Determinants of Health   Financial Resource Strain: Not on file  Food Insecurity: Not on file  Transportation Needs: Not on file  Physical Activity: Not on file  Stress: Not on file  Social Connections: Not on file  Intimate Partner Violence: Not on file    Subjective: Review of Systems  Constitutional:  Positive for chills and diaphoresis. Negative for fever.  HENT:  Negative for congestion and hearing loss.   Eyes:  Negative for blurred vision and double vision.  Respiratory:  Negative for cough and shortness of breath.   Cardiovascular:  Negative for chest pain and palpitations.  Gastrointestinal:  Positive for abdominal pain and heartburn. Negative for blood in stool, constipation, diarrhea, melena and vomiting.  Genitourinary:  Negative for dysuria and urgency.  Musculoskeletal:  Negative for joint pain and myalgias.  Skin:  Negative for itching and rash.  Neurological:  Negative for dizziness and headaches.  Psychiatric/Behavioral:  Negative for depression. The patient is not nervous/anxious.       Objective: BP 120/70   Pulse 72   Temp 97.9 F (36.6 C)   Ht '5\' 9"'$  (1.753 m)   Wt 218  lb (98.9 kg)   BMI 32.19 kg/m  Physical Exam Constitutional:      Appearance: Normal appearance.  HENT:     Head: Normocephalic and atraumatic.  Eyes:     Extraocular Movements: Extraocular movements intact.     Conjunctiva/sclera: Conjunctivae normal.  Cardiovascular:     Rate and Rhythm: Normal rate and regular rhythm.  Pulmonary:     Effort: Pulmonary effort is normal.     Breath sounds: Normal breath sounds.  Abdominal:     General: Bowel sounds are normal.     Palpations: Abdomen is soft.  Musculoskeletal:        General: Normal range of motion.     Cervical back: Normal range of motion and neck supple.  Skin:    General: Skin is warm.  Neurological:     General: No focal deficit present.     Mental Status: He is alert and oriented to person, place, and time.  Psychiatric:        Mood and Affect: Mood normal.        Behavior: Behavior normal.     Assessment: *Epigastric abdominal pain *Nausea and vomiting  *GERD *Colon cancer screening *Flushing   Plan: Etiology of patient's GI symptoms unclear.  Will schedule for EGD to evaluate for peptic ulcer disease, esophagitis, gastritis, H. Pylori, duodenitis, or other. Will also evaluate for esophageal stricture, Schatzki's ring, esophageal web or other.   The risks including infection, bleed, or perforation as well as benefits, limitations, alternatives and imponderables have been reviewed with the patient. Potential for esophageal dilation, biopsy, etc. have also been reviewed.  Questions have been answered. All parties agreeable.  We will increase Protonix to 40 mg daily.  We will send in prescription for ondansetron to take as needed.  Discussed that this could be cannabinoid hyperemesis syndrome.  Recommended discontinuation of marijuana use.  More information printed out for patient.  Patient needs colonoscopy for colon cancer screening purposes though I am unsure if he would tolerate prep given his upper GI  symptoms.  We will hold off for now.  Revisit on follow-up visit.  May consider further gallbladder work-up including HIDA scan in the future pending above work-up.  Follow-up after procedures.  09/01/2021 3:18 PM   Disclaimer: This note was dictated with voice recognition software. Similar sounding words can inadvertently be transcribed and may not be corrected upon review.

## 2021-09-01 NOTE — Progress Notes (Signed)
? ? ?Primary Care Physician:  Patient, No Pcp Per (Inactive) ?Primary Gastroenterologist:  Dr. Abbey Chatters ? ?Chief Complaint  ?Patient presents with  ? New Patient (Initial Visit)  ?  ER referral  ? ? ?HPI:   ?Edward [name] is a 51 y.o. male who presents to the clinic today as a new patient for ER follow-up visit.  Patient states for approximately 7 months off-and-on he has had recurrent episodes of epigastric abdominal pain followed by 2 to 3 days of nausea and vomiting.  These episodes are separated weeks at a time.  When they occur he will be in bed for multiple days.  Also notes profuse sweating as well as chills.  Symptoms not related with meals.  Denies any exposure to ticks.  Denies any symptoms associated with eating red meat or pork.  No dysphagia/odynophagia.  No melena hematochezia.  Intermittent ibuprofen use.  No previous upper endoscopy.   ? ?Does note acid reflux and heartburn as well.  Currently on Protonix 20 mg daily which is helped some. ? ?No change in his bowels.  Denies any constipation or diarrhea.  No family history of colorectal malignancy.  No previous colonoscopy or any colon cancer modality.  Does note daily marijuana use.  Also chronically on Suboxone. ? ?Past Medical History:  ?Diagnosis Date  ? Bronchitis   ? ?Patient denies any previous surgeries ? ?Current Outpatient Medications  ?Medication Sig Dispense Refill  ? Buprenorphine HCl-Naloxone HCl (SUBOXONE) 8-2 MG FILM Place 1 Film under the tongue in the morning and at bedtime. 60 each 0  ? gabapentin (NEURONTIN) 300 MG capsule Take 1 capsule (300 mg total) by mouth 3 (three) times daily. 90 capsule 5  ? pantoprazole (PROTONIX) 20 MG tablet TAKE 1 TABLET(20 MG) BY MOUTH DAILY 30 tablet 1  ? senna (SENOKOT) 8.6 MG TABS tablet Take 1 tablet (8.6 mg total) by mouth daily. 90 tablet 3  ? DULoxetine (CYMBALTA) 30 MG capsule Take 2 capsules (60 mg total) by mouth daily. 60 capsule 2  ? ?No current facility-administered medications for this  visit.  ? ? ?Allergies as of 09/01/2021  ? (No Known Allergies)  ? ? ?Family History  ?Problem Relation Age of Onset  ? Diabetes Mother   ? Heart disease Father   ? Prostate cancer Father   ? ? ?Social History  ? ?Socioeconomic History  ? Marital status: Married  ?  Spouse name: Not on file  ? Number of children: Not on file  ? Years of education: Not on file  ? Highest education level: Not on file  ?Occupational History  ? Not on file  ?Tobacco Use  ? Smoking status: Never  ? Smokeless tobacco: Not on file  ?Vaping Use  ? Vaping Use: Never used  ?Substance and Sexual Activity  ? Alcohol use: Not Currently  ? Drug use: Yes  ?  Frequency: 7.0 times per week  ?  Types: Marijuana  ? Sexual activity: Not on file  ?Other Topics Concern  ? Not on file  ?Social History Narrative  ? Not on file  ? ?Social Determinants of Health  ? ?Financial Resource Strain: Not on file  ?Food Insecurity: Not on file  ?Transportation Needs: Not on file  ?Physical Activity: Not on file  ?Stress: Not on file  ?Social Connections: Not on file  ?Intimate Partner Violence: Not on file  ? ? ?Subjective: ?Review of Systems  ?Constitutional:  Positive for chills and diaphoresis. Negative for fever.  ?HENT:  Negative for congestion and hearing loss.   ?Eyes:  Negative for blurred vision and double vision.  ?Respiratory:  Negative for cough and shortness of breath.   ?Cardiovascular:  Negative for chest pain and palpitations.  ?Gastrointestinal:  Positive for abdominal pain and heartburn. Negative for blood in stool, constipation, diarrhea, melena and vomiting.  ?Genitourinary:  Negative for dysuria and urgency.  ?Musculoskeletal:  Negative for joint pain and myalgias.  ?Skin:  Negative for itching and rash.  ?Neurological:  Negative for dizziness and headaches.  ?Psychiatric/Behavioral:  Negative for depression. The patient is not nervous/anxious.    ? ? ? ?Objective: ?BP 120/70   Pulse 72   Temp 97.9 ?F (36.6 ?C)   Ht '5\' 9"'$  (1.753 m)   Wt 218  lb (98.9 kg)   BMI 32.19 kg/m?  ?Physical Exam ?Constitutional:   ?   Appearance: Normal appearance.  ?HENT:  ?   Head: Normocephalic and atraumatic.  ?Eyes:  ?   Extraocular Movements: Extraocular movements intact.  ?   Conjunctiva/sclera: Conjunctivae normal.  ?Cardiovascular:  ?   Rate and Rhythm: Normal rate and regular rhythm.  ?Pulmonary:  ?   Effort: Pulmonary effort is normal.  ?   Breath sounds: Normal breath sounds.  ?Abdominal:  ?   General: Bowel sounds are normal.  ?   Palpations: Abdomen is soft.  ?Musculoskeletal:     ?   General: Normal range of motion.  ?   Cervical back: Normal range of motion and neck supple.  ?Skin: ?   General: Skin is warm.  ?Neurological:  ?   General: No focal deficit present.  ?   Mental Status: He is alert and oriented to person, place, and time.  ?Psychiatric:     ?   Mood and Affect: Mood normal.     ?   Behavior: Behavior normal.  ? ? ? ?Assessment: ?*Epigastric abdominal pain ?*Nausea and vomiting  ?*GERD ?*Colon cancer screening ?*Flushing  ? ?Plan: ?Etiology of patient's GI symptoms unclear. ? ?Will schedule for EGD to evaluate for peptic ulcer disease, esophagitis, gastritis, H. Pylori, duodenitis, or other. Will also evaluate for esophageal stricture, Schatzki's ring, esophageal web or other.  ? ?The risks including infection, bleed, or perforation as well as benefits, limitations, alternatives and imponderables have been reviewed with the patient. Potential for esophageal dilation, biopsy, etc. have also been reviewed.  Questions have been answered. All parties agreeable. ? ?We will increase Protonix to 40 mg daily.  We will send in prescription for ondansetron to take as needed. ? ?Discussed that this could be cannabinoid hyperemesis syndrome.  Recommended discontinuation of marijuana use.  More information printed out for patient. ? ?Patient needs colonoscopy for colon cancer screening purposes though I am unsure if he would tolerate prep given his upper GI  symptoms.  We will hold off for now.  Revisit on follow-up visit. ? ?May consider further gallbladder work-up including HIDA scan in the future pending above work-up. ? ?Follow-up after procedures. ? ?09/01/2021 3:18 PM ? ? ?Disclaimer: This note was dictated with voice recognition software. Similar sounding words can inadvertently be transcribed and may not be corrected upon review. ? ?

## 2021-09-01 NOTE — Patient Instructions (Signed)
We will schedule you for upper endoscopy to further evaluate your abdominal pain, recurrent nausea and vomiting. ? ?I am going to increase your Protonix to 40 mg daily.  I have sent this to your pharmacy. ? ?I will also send in Zofran to take as needed for your nausea and vomiting. ? 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  Would recommend discontinuing marijuana use. ? ?We will hold off on colonoscopy for colon cancer screening purposes for now.  We will revisit this on follow-up visit. ? ?We may need to consider HIDA scan to further evaluate your gallbladder. ? ?It was very nice meeting both you today. ? ?Dr. Abbey Chatters ? ?Lifestyle and home remedies TO MANAGE REFLUX/HEARTBURN ? ?  ?You may eliminate or reduce the frequency of heartburn by making the following lifestyle changes: ?  ?Control your weight. Being overweight is a major risk factor for heartburn and GERD. Excess pounds put pressure on your abdomen, pushing up your stomach and causing acid to back up into your esophagus.  ?  ?Eat smaller meals. 4 TO 6 MEALS A DAY. This reduces pressure on the lower esophageal sphincter, helping to prevent the valve from opening and acid from washing back into your esophagus. ?  ?  ?Loosen your belt. Clothes that fit tightly around your waist put pressure on your abdomen and the lower esophageal sphincter. ?  ?  ?Eliminate heartburn triggers. Everyone has specific triggers. Common triggers such as fatty or fried foods, spicy food, tomato sauce, carbonated beverages, alcohol, chocolate, mint, garlic, onion, caffeine and nicotine may make heartburn worse.  ?  ?Avoid stooping or bending. Tying your shoes is OK. Bending over for longer periods to weed your garden isn't, especially soon after eating.  ?  ?Don't lie down after a meal. Wait at least three to four hours after eating before going to bed, and don't lie down right after eating.  ? ? ?At [facility] we value your feedback. You may receive a survey  about your visit today. Please share your experience as we strive to create trusting relationships with our patients to provide genuine, compassionate, quality care. ? ?We appreciate your understanding and patience as we review any laboratory studies, imaging, and other diagnostic tests that are ordered as we care for you. Our office policy is 5 business days for review of these results, and any emergent or urgent results are addressed in a timely manner for your best interest. If you do not hear from our office in 1 week, please contact us.  ? ?We also encourage the use of MyChart, which contains your medical information for your review as well. If you are not enrolled in this feature, an access code is on this after visit summary for your convenience. Thank you for allowing [place] to be involved in your care. ? ?It was great to see you today!  I hope you have a great rest of your Spring! ? ? ? ?Edward [name]. Abbey Chatters, D.O. ?Gastroenterology and Hepatology ?[facility] ? ?

## 2021-09-09 ENCOUNTER — Emergency Department (HOSPITAL_COMMUNITY)
Admission: EM | Admit: 2021-09-09 | Discharge: 2021-09-09 | Discharge status: Against Medical Advice | Payer: 59 | Source: Home / Self Care

## 2021-09-14 ENCOUNTER — Ambulatory Visit (INDEPENDENT_AMBULATORY_CARE_PROVIDER_SITE_OTHER): Payer: 59 | Admitting: Student

## 2021-09-14 DIAGNOSIS — F1190 Opioid use, unspecified, uncomplicated: Secondary | ICD-10-CM

## 2021-09-14 DIAGNOSIS — F419 Anxiety disorder, unspecified: Secondary | ICD-10-CM

## 2021-09-14 DIAGNOSIS — F32A Anxiety disorder, unspecified: Secondary | ICD-10-CM

## 2021-09-14 MED ORDER — DULOXETINE HCL 30 MG PO CPEP: 60.0000 mg | ORAL_CAPSULE | Freq: Every day | ORAL | 5 refills | Status: DC

## 2021-09-14 NOTE — Patient Instructions (Addendum)
Opoid use Please continue suboxone 8-2 mg films twice daily  Mood and back pain Continue duloxetine 60 mg daily   Follow up in 2 months for suboxone and to establish with PCP with our clinic

## 2021-09-15 ENCOUNTER — Encounter (HOSPITAL_COMMUNITY): Payer: Self-pay

## 2021-09-15 NOTE — Patient Instructions (Addendum)
Edward [name]  09/15/2021     '@PREFPERIOPPHARMACY'$ @   Your procedure is scheduled on  09/20/2021.   Report to [facility] at  1045 AM.    Call this number if you have problems the morning of surgery:  [phone]   Remember:  Follow the diet instructions given to you by the office.     Take these medicines the morning of surgery with A SIP OF WATER            suboxone, cymbalta, gabapentin, zofran (if needed), protonix.     Do not wear jewelry, make-up or nail polish.  Do not wear lotions, powders, or perfumes, or deodorant.  Do not shave 48 hours prior to surgery.  Men may shave face and neck.  Do not bring valuables to the hospital.  [facility] is not responsible for any belongings or valuables.  Contacts, dentures or bridgework may not be worn into surgery.  Leave your suitcase in the car.  After surgery it may be brought to your room.  For patients admitted to the hospital, discharge time will be determined by your treatment team.  Patients discharged the day of surgery will not be allowed to drive home and must have someone with them for 24 hours.    Special instructions:   DO NOT smoke tobacco or vape for 24 hours before your procedure.  Please read over the following fact sheets that you were given. Anesthesia Post-op Instructions and Care and Recovery After Surgery        Upper Endoscopy, Adult, Care After This sheet gives you information about how to care for yourself after your procedure. Your health care provider may also give you more specific instructions. If you have problems or questions, contact your health care provider. What can I expect after the procedure? After the procedure, it is common to have: A sore throat. Mild stomach pain or discomfort. Bloating. Nausea. Follow these instructions at home:  Follow instructions from your health care provider about what to eat or drink after your procedure. Return to your normal activities as  told by your health care provider. Ask your health care provider what activities are safe for you. Take over-the-counter and prescription medicines only as told by your health care provider. If you were given a sedative during the procedure, it can affect you for several hours. Do not drive or operate machinery until your health care provider says that it is safe. Keep all follow-up visits as told by your health care provider. This is important. Contact a health care provider if you have: A sore throat that lasts longer than one day. Trouble swallowing. Get help right away if: You vomit blood or your vomit looks like coffee grounds. You have: A fever. Bloody, black, or tarry stools. A severe sore throat or you cannot swallow. Difficulty breathing. Severe pain in your chest or abdomen. Summary After the procedure, it is common to have a sore throat, mild stomach discomfort, bloating, and nausea. If you were given a sedative during the procedure, it can affect you for several hours. Do not drive or operate machinery until your health care provider says that it is safe. Follow instructions from your health care provider about what to eat or drink after your procedure. Return to your normal activities as told by your health care provider. This information is not intended to replace advice given to you by your health care provider. Make sure you discuss any  questions you have with your health care provider. Document Revised: 02/08/2019 Document Reviewed: 09/04/2017 Elsevier Patient Education  [place] After This sheet gives you information about how to care for yourself after your procedure. Your health care provider may also give you more specific instructions. If you have problems or questions, contact your health care provider. What can I expect after the procedure? After the procedure, it is common to have: Tiredness. Forgetfulness about what  happened after the procedure. Impaired judgment for important decisions. Nausea or vomiting. Some difficulty with balance. Follow these instructions at home: For the time period you were told by your health care provider:     Rest as needed. Do not participate in activities where you could fall or become injured. Do not drive or use machinery. Do not drink alcohol. Do not take sleeping pills or medicines that cause drowsiness. Do not make important decisions or sign legal documents. Do not take care of children on your own. Eating and drinking Follow the diet that is recommended by your health care provider. Drink enough fluid to keep your urine pale yellow. If you vomit: Drink water, juice, or soup when you can drink without vomiting. Make sure you have little or no nausea before eating solid foods. General instructions Have a responsible adult stay with you for the time you are told. It is important to have someone help care for you until you are awake and alert. Take over-the-counter and prescription medicines only as told by your health care provider. If you have sleep apnea, surgery and certain medicines can increase your risk for breathing problems. Follow instructions from your health care provider about wearing your sleep device: Anytime you are sleeping, including during daytime naps. While taking prescription pain medicines, sleeping medicines, or medicines that make you drowsy. Avoid smoking. Keep all follow-up visits as told by your health care provider. This is important. Contact a health care provider if: You keep feeling nauseous or you keep vomiting. You feel light-headed. You are still sleepy or having trouble with balance after 24 hours. You develop a rash. You have a fever. You have redness or swelling around the IV site. Get help right away if: You have trouble breathing. You have new-onset confusion at home. Summary For several hours after your procedure,  you may feel tired. You may also be forgetful and have poor judgment. Have a responsible adult stay with you for the time you are told. It is important to have someone help care for you until you are awake and alert. Rest as told. Do not drive or operate machinery. Do not drink alcohol or take sleeping pills. Get help right away if you have trouble breathing, or if you suddenly become confused. This information is not intended to replace advice given to you by your health care provider. Make sure you discuss any questions you have with your health care provider. Document Revised: 03/09/2021 Document Reviewed: 03/07/2019 Elsevier Patient Education  [place].

## 2021-09-15 NOTE — Assessment & Plan Note (Addendum)
Improved cravings with increasing subonxone to 2 times daily.  Currently looking for a job.  He is engaged to be married in June.  He denies any issues with relapse or withdrawals.  No increased constipation with increasing the dose of Suboxone.  He requests lengthening time between visits due to difficulty with getting time off work.  He currently does not have a PCP.  He would be interested in establishing with [facility].  We will send message to arrange this for him.  Continue Suboxone 8-2 mg films twice daily Follow-up in 2 months Toxassure at next visit

## 2021-09-15 NOTE — Assessment & Plan Note (Signed)
PHQ 9 is 9 today. Is taking duloxetine 60 mg daily. Will continue on Duloxetine as this seems to be helping with mood and back/hip pain.

## 2021-09-15 NOTE — Progress Notes (Signed)
   09/15/2021  Edward [name] presents for follow up of opioid use disorder I have reviewed the prior induction visit, follow up visits, and telephone encounters relevant to opiate use disorder (OUD) treatment.   Current daily dose: Suboxne 802 mg twice daily  Date of Induction: 05/18/2021  Current follow up interval, in weeks: 4 weeks  The patient has been adherent with the buprenorphine for OUD contract.   Last UDS Result: 07/20/2021 appropriate    HPI: Edward [name] presents today for OUD follow-up.  He is doing well on increased dose of Suboxone. Please refer to problem based charting for further details and assessment and plan of current problem and chronic medical conditions.  Exam:   Vitals:   09/14/21 0847  BP: 139/85  Pulse: 60  Temp: 98.1 F (36.7 C)  TempSrc: Oral  SpO2: 99%  Weight: 221 lb 12.8 oz (100.6 kg)    Constitutional: Appears well-developed and well-nourished. No distress.  HENT: Normocephalic and atraumatic, EOMI, conjunctiva normal, moist mucous membranes Cardiovascular: Normal rate, regular rhythm   Respiratory: No respiratory distress, no accessory muscle use.  Effort is normal.  Lungs are clear to auscultation bilaterally. GI: Nondistended, soft, nontender to palpation, normal active bowel sounds Musculoskeletal: Normal bulk and tone.  No peripheral edema noted. Neurological: Is alert and oriented x4 Skin: Warm and dry.  No rash, erythema, lesions noted. Psychiatric: Normal mood and affect.  Assessment/Plan:  See Problem Based Charting in the Encounters Tab   Edward [name], Edward  09/15/2021  1:24 PM

## 2021-09-16 ENCOUNTER — Encounter (HOSPITAL_COMMUNITY)
Admission: RE | Admit: 2021-09-16 | Discharge: 2021-09-16 | Disposition: A | Discharge status: Home / Self Care | Payer: 59 | Source: Ambulatory Visit | Attending: Internal Medicine | Admitting: Internal Medicine

## 2021-09-16 NOTE — Progress Notes (Signed)
Internal Medicine Clinic Attending  Case discussed with the resident at the time of the visit.  We reviewed the resident's history and exam and pertinent patient test results.  I agree with the assessment, diagnosis, and plan of care documented in the resident's note.  

## 2021-09-20 ENCOUNTER — Ambulatory Visit (HOSPITAL_COMMUNITY): Payer: 59 | Admitting: Anesthesiology

## 2021-09-20 ENCOUNTER — Ambulatory Visit (HOSPITAL_COMMUNITY)
Admission: RE | Admit: 2021-09-20 | Discharge: 2021-09-20 | Disposition: A | Discharge status: Home / Self Care | Payer: 59 | Attending: Internal Medicine | Admitting: Internal Medicine

## 2021-09-20 ENCOUNTER — Encounter (HOSPITAL_COMMUNITY)
Admission: RE | Disposition: A | Discharge status: Home / Self Care | Payer: Self-pay | Source: Home / Self Care | Attending: Internal Medicine

## 2021-09-20 ENCOUNTER — Ambulatory Visit (HOSPITAL_BASED_OUTPATIENT_CLINIC_OR_DEPARTMENT_OTHER): Payer: 59 | Admitting: Anesthesiology

## 2021-09-20 ENCOUNTER — Encounter (HOSPITAL_COMMUNITY): Payer: Self-pay

## 2021-09-20 DIAGNOSIS — F1290 Cannabis use, unspecified, uncomplicated: Secondary | ICD-10-CM | POA: Diagnosis not present

## 2021-09-20 DIAGNOSIS — Z79899 Other long term (current) drug therapy: Secondary | ICD-10-CM | POA: Insufficient documentation

## 2021-09-20 DIAGNOSIS — R112 Nausea with vomiting, unspecified: Secondary | ICD-10-CM | POA: Insufficient documentation

## 2021-09-20 DIAGNOSIS — R1013 Epigastric pain: Secondary | ICD-10-CM | POA: Diagnosis present

## 2021-09-20 DIAGNOSIS — K219 Gastro-esophageal reflux disease without esophagitis: Secondary | ICD-10-CM | POA: Insufficient documentation

## 2021-09-20 DIAGNOSIS — K2970 Gastritis, unspecified, without bleeding: Secondary | ICD-10-CM

## 2021-09-20 HISTORY — PX: BIOPSY: SHX5522

## 2021-09-20 HISTORY — PX: ESOPHAGOGASTRODUODENOSCOPY (EGD) WITH PROPOFOL: SHX5813

## 2021-09-20 SURGERY — ESOPHAGOGASTRODUODENOSCOPY (EGD) WITH PROPOFOL
Anesthesia: General

## 2021-09-20 MED ORDER — PROPOFOL 10 MG/ML IV BOLUS
INTRAVENOUS | Status: DC | PRN
  Administered 2021-09-20: 60 mg via INTRAVENOUS
  Administered 2021-09-20: 100 mg via INTRAVENOUS

## 2021-09-20 MED ORDER — LACTATED RINGERS IV SOLN: INTRAVENOUS | Status: DC | PRN

## 2021-09-20 MED ORDER — PANTOPRAZOLE SODIUM 40 MG PO TBEC: 40.0000 mg | DELAYED_RELEASE_TABLET | Freq: Two times a day (BID) | ORAL | 5 refills | Status: DC

## 2021-09-20 MED ORDER — LIDOCAINE HCL (CARDIAC) PF 100 MG/5ML IV SOSY
PREFILLED_SYRINGE | INTRAVENOUS | Status: DC | PRN
  Administered 2021-09-20: 60 mg via INTRATRACHEAL

## 2021-09-20 MED ORDER — LACTATED RINGERS IV SOLN: INTRAVENOUS | Status: DC

## 2021-09-20 NOTE — Anesthesia Preprocedure Evaluation (Signed)
Anesthesia Evaluation  Patient identified by MRN, date of birth, ID band Patient awake    Reviewed: Allergy & Precautions, H&P , NPO status , Patient's Chart, lab work & pertinent test results, reviewed documented beta blocker date and time   Airway Mallampati: II  TM Distance: >3 FB Neck ROM: full    Dental no notable dental hx.    Pulmonary neg pulmonary ROS,    Pulmonary exam normal breath sounds clear to auscultation       Cardiovascular Exercise Tolerance: Good negative cardio ROS   Rhythm:regular Rate:Normal     Neuro/Psych PSYCHIATRIC DISORDERS Anxiety Depression negative neurological ROS     GI/Hepatic Neg liver ROS, GERD  Medicated,  Endo/Other  negative endocrine ROS  Renal/GU negative Renal ROS  negative genitourinary   Musculoskeletal   Abdominal   Peds  Hematology negative hematology ROS (+)   Anesthesia Other Findings   Reproductive/Obstetrics negative OB ROS                             Anesthesia Physical Anesthesia Plan  ASA: 2  Anesthesia Plan: General   Post-op Pain Management:    Induction:   PONV Risk Score and Plan: Propofol infusion  Airway Management Planned:   Additional Equipment:   Intra-op Plan:   Post-operative Plan:   Informed Consent: I have reviewed the patients History and Physical, chart, labs and discussed the procedure including the risks, benefits and alternatives for the proposed anesthesia with the patient or authorized representative who has indicated his/her understanding and acceptance.     Dental Advisory Given  Plan Discussed with: CRNA  Anesthesia Plan Comments:         Anesthesia Quick Evaluation

## 2021-09-20 NOTE — Transfer of Care (Signed)
Immediate Anesthesia Transfer of Care Note  Patient: Edward [name]  Procedure(s) Performed: ESOPHAGOGASTRODUODENOSCOPY (EGD) WITH PROPOFOL BIOPSY  Patient Location: Short Stay  Anesthesia Type:General  Level of Consciousness: awake, alert  and oriented  Airway & Oxygen Therapy: Patient Spontanous Breathing  Post-op Assessment: Report given to RN and Post -op Vital signs reviewed and stable  Post vital signs: Reviewed and stable  Last Vitals:  Vitals Value Taken Time  BP    Temp    Pulse    Resp    SpO2      Last Pain:  Vitals:   09/20/21 1115  TempSrc:   PainSc: 0-No pain      Patients Stated Pain Goal: (P) 5 (34/62/19 4712)  Complications: No notable events documented.

## 2021-09-20 NOTE — Interval H&P Note (Signed)
History and Physical Interval Note:  09/20/2021 11:10 AM  Edward [name] E Prewitt  has presented today for surgery, with the diagnosis of nausea/vomiting, GERD, epigastric pain.  The various methods of treatment have been discussed with the patient and family. After consideration of risks, benefits and other options for treatment, the patient has consented to  Procedure(s) with comments: ESOPHAGOGASTRODUODENOSCOPY (EGD) WITH PROPOFOL (N/A) - 12:15pm as a surgical intervention.  The patient's history has been reviewed, patient examined, no change in status, stable for surgery.  I have reviewed the patient's chart and labs.  Questions were answered to the patient's satisfaction.     [name]

## 2021-09-20 NOTE — Discharge Instructions (Addendum)
EGD Discharge instructions Please read the instructions outlined below and refer to this sheet in the next few weeks. These discharge instructions provide you with general information on caring for yourself after you leave the hospital. Your doctor may also give you specific instructions. While your treatment has been planned according to the most current medical practices available, unavoidable complications occasionally occur. If you have any problems or questions after discharge, please call your doctor. ACTIVITY You may resume your regular activity but move at a slower pace for the next 24 hours.  Take frequent rest periods for the next 24 hours.  Walking will help expel (get rid of) the air and reduce the bloated feeling in your abdomen.  No driving for 24 hours (because of the anesthesia (medicine) used during the test).  You may shower.  Do not sign any important legal documents or operate any machinery for 24 hours (because of the anesthesia used during the test).  NUTRITION Drink plenty of fluids.  You may resume your normal diet.  Begin with a light meal and progress to your normal diet.  Avoid alcoholic beverages for 24 hours or as instructed by your caregiver.  MEDICATIONS You may resume your normal medications unless your caregiver tells you otherwise.  WHAT YOU CAN EXPECT TODAY You may experience abdominal discomfort such as a feeling of fullness or "gas" pains.  FOLLOW-UP Your doctor will discuss the results of your test with you.  SEEK IMMEDIATE MEDICAL ATTENTION IF ANY OF THE FOLLOWING OCCUR: Excessive nausea (feeling sick to your stomach) and/or vomiting.  Severe abdominal pain and distention (swelling).  Trouble swallowing.  Temperature over 101 F (37.8 C).  Rectal bleeding or vomiting of blood.   Your EGD revealed mild amount inflammation in your stomach.  I took biopsies of this to rule out infection with a bacteria called H. pylori.  Await pathology results, my  office will contact you. I am going to increase your pantoprazole to 40 mg twice daily for the next 12 weeks. Avoid NSAIDs as best as you can. Work on decreasing marijuana use. Follow up with GI in 6 months    I hope you have a great rest of your week!  Elon Alas. Abbey Chatters, D.O. Gastroenterology and Hepatology [facility]

## 2021-09-20 NOTE — Op Note (Signed)
[facility] Patient Name: Kingjames Mihok Procedure Date: 09/20/2021 11:06 AM MRN: [id number] Date of Birth: 1970-08-22 Attending MD: Elon Alas. Abbey Chatters DO CSN: [id number] Age: 51 Admit Type: Outpatient Procedure:                Upper GI endoscopy Indications:              Epigastric abdominal pain, Heartburn, Nausea with                            vomiting Providers:                Elon Alas. Abbey Chatters, DO, Caprice Kluver, Crystal Page,                            Raphael Gibney, Technician Referring MD:              Medicines:                See the Anesthesia note for documentation of the                            administered medications Complications:            No immediate complications. Estimated blood loss:                            None. Estimated Blood Loss:     Estimated blood loss: none. Procedure:                Pre-Anesthesia Assessment:                           - The anesthesia plan was to use monitored                            anesthesia care (MAC).                           After obtaining informed consent, the endoscope was                            passed under direct vision. Throughout the                            procedure, the patient's blood pressure, pulse, and                            oxygen saturations were monitored continuously. The                            [id number]-H190 ([id number]) scope was introduced through the                            mouth, and advanced to the second part of duodenum.                            The upper GI endoscopy was accomplished without  difficulty. The patient tolerated the procedure                            well. Scope In: 11:19:20 AM Scope Out: 11:22:42 AM Total Procedure Duration: 0 hours 3 minutes 22 seconds  Findings:      The Z-line was regular and was found 42 cm from the incisors.      Patchy mild inflammation characterized by erythema was found in the       gastric body. Biopsies were  taken with a cold forceps for Helicobacter       pylori testing.      The duodenal bulb, first portion of the duodenum and second portion of       the duodenum were normal. Impression:               - Z-line regular, 42 cm from the incisors.                           - Gastritis. Biopsied.                           - Normal duodenal bulb, first portion of the                            duodenum and second portion of the duodenum. Moderate Sedation:      Per Anesthesia Care Recommendation:           - Patient has a contact number available for                            emergencies. The signs and symptoms of potential                            delayed complications were discussed with the                            patient. Return to normal activities tomorrow.                            Written discharge instructions were provided to the                            patient.                           - Resume previous diet.                           - Continue present medications.                           - Await pathology results.                           - Use Protonix (pantoprazole) 40 mg PO BID for 12  weeks.                           - Return to GI clinic in 6 months.                           - Decrease marijuana use Procedure Code(s):        --- Professional ---                           [phone], Esophagogastroduodenoscopy, flexible,                            transoral; with biopsy, single or multiple Diagnosis Code(s):        --- Professional ---                           K29.70, Gastritis, unspecified, without bleeding                           R10.13, Epigastric pain                           R12, Heartburn                           R11.2, Nausea with vomiting, unspecified CPT copyright 2019 American Medical Association. All rights reserved. The codes documented in this report are preliminary and upon coder review may  be revised to meet current  compliance requirements. Elon Alas. Abbey Chatters, DO [place] Abbey Chatters, DO 09/20/2021 11:29:09 AM This report has been signed electronically. Number of Addenda: 0

## 2021-09-21 LAB — SURGICAL PATHOLOGY

## 2021-09-21 NOTE — Anesthesia Postprocedure Evaluation (Signed)
Anesthesia Post Note  Patient: Edward [name]  Procedure(s) Performed: ESOPHAGOGASTRODUODENOSCOPY (EGD) WITH PROPOFOL BIOPSY  Patient location during evaluation: Phase II Anesthesia Type: General Level of consciousness: awake Pain management: pain level controlled Vital Signs Assessment: post-procedure vital signs reviewed and stable Respiratory status: spontaneous breathing and respiratory function stable Cardiovascular status: blood pressure returned to baseline and stable Postop Assessment: no headache and no apparent nausea or vomiting Anesthetic complications: no Comments: Late entry   No notable events documented.   Last Vitals:  Vitals:   09/20/21 1030 09/20/21 1127  BP: (P) 116/72 106/76  Pulse: (P) 74 74  Resp: (P) 18 18  Temp: (P) 36.9 C 37.1 C  SpO2: (P) 96% 98%    Last Pain:  Vitals:   09/20/21 1127  TempSrc: Oral  PainSc: 0-No pain                 Louann Sjogren

## 2021-09-28 ENCOUNTER — Encounter (HOSPITAL_COMMUNITY): Payer: Self-pay | Admitting: Internal Medicine

## 2021-09-30 ENCOUNTER — Telehealth: Payer: Self-pay

## 2021-09-30 ENCOUNTER — Telehealth: Payer: Self-pay | Admitting: Internal Medicine

## 2021-09-30 MED ORDER — PROMETHAZINE HCL 12.5 MG PO TABS
12.5000 mg | ORAL_TABLET | Freq: Four times a day (QID) | ORAL | 0 refills | Status: DC | PRN
Start: 2021-09-30 — End: 2022-01-20

## 2021-09-30 NOTE — Telephone Encounter (Signed)
Pt was made aware.  

## 2021-09-30 NOTE — Telephone Encounter (Signed)
Phenergan sent in to patient's pharmacy to take for nausea/vomiting.  Please counsel him that this medication may make him tired.

## 2021-09-30 NOTE — Telephone Encounter (Signed)
Pt's fiancee called stating that the pt had a procedure done on 09/20/21 and that he has been vomiting and not being able to keep anything done. Also states that the protonix is not working. Pt is supposed to get married Sat and is wanting to know what he can do to get better.

## 2021-09-30 NOTE — Telephone Encounter (Signed)
Pt was made aware of medication being called in, pt states that he has not ate anything since Tuesday and is throwing up yellow fluid.

## 2021-10-01 ENCOUNTER — Telehealth: Payer: Self-pay | Admitting: *Deleted

## 2021-10-01 MED ORDER — BUPRENORPHINE HCL-NALOXONE HCL 8-2 MG SL FILM: 2.0000 | ORAL_FILM | Freq: Every day | SUBLINGUAL | 1 refills | Status: DC

## 2021-10-01 NOTE — Telephone Encounter (Signed)
Patient's fiance called in requesting refill on suboxone. States they thought there was a refill at the pharmacy but there is not.

## 2021-10-01 NOTE — Telephone Encounter (Signed)
Rx sent +1 refill.

## 2021-10-05 ENCOUNTER — Telehealth: Payer: Self-pay

## 2021-10-05 NOTE — Telephone Encounter (Signed)
Pt's friend called and advised the pt had EMS to take him to the ER 18 th for severe nausea and vomting.she wants you to talk with the pt regarding seeking help for him. Reading the note from the ED they are saying due to marijuana use (as you did) . His number is [phone]. His first visit with [place] was due to ED visit.

## 2021-10-06 ENCOUNTER — Encounter: Payer: Self-pay | Admitting: Internal Medicine

## 2021-10-06 ENCOUNTER — Ambulatory Visit (INDEPENDENT_AMBULATORY_CARE_PROVIDER_SITE_OTHER): Payer: 59 | Admitting: Internal Medicine

## 2021-10-06 DIAGNOSIS — R1013 Epigastric pain: Secondary | ICD-10-CM | POA: Diagnosis not present

## 2021-10-06 DIAGNOSIS — G8929 Other chronic pain: Secondary | ICD-10-CM | POA: Insufficient documentation

## 2021-10-06 DIAGNOSIS — E876 Hypokalemia: Secondary | ICD-10-CM | POA: Insufficient documentation

## 2021-10-06 DIAGNOSIS — K219 Gastro-esophageal reflux disease without esophagitis: Secondary | ICD-10-CM

## 2021-10-06 DIAGNOSIS — R112 Nausea with vomiting, unspecified: Secondary | ICD-10-CM | POA: Diagnosis not present

## 2021-10-06 DIAGNOSIS — F1290 Cannabis use, unspecified, uncomplicated: Secondary | ICD-10-CM

## 2021-10-06 MED ORDER — METOCLOPRAMIDE HCL 5 MG PO TABS: 5.0000 mg | ORAL_TABLET | Freq: Four times a day (QID) | ORAL | 1 refills | Status: DC

## 2021-10-06 NOTE — Patient Instructions (Signed)
For your nausea and vomiting I am going to send in a new medication called metoclopramide 5 mg.  You can take this up to 4 times a day.  You can take your Phenergan on top of this as needed, would be best to take this at night to help you sleep.  Would recommend complete marijuana cessation.  Follow-up in 6 to 8 weeks.  It was very nice seeing you again today.  I hope your wife recovers well from her pneumonia.  Dr. Abbey Chatters

## 2021-10-06 NOTE — Progress Notes (Signed)
Primary Care Physician:  Patient, No Pcp Per Primary Gastroenterologist:  Dr. Abbey Chatters  Chief Complaint  Patient presents with   Vomiting    Having vomiting, nausea, abdominal pain and cramping. States he has lost 20 lbs in past 2 weeks. Wonders if stress may be causing some of symptoms. [name] 4 days ago.     HPI:   Edward [name] is a 51 y.o. male who presents to the clinic today For follow-up visit.  Patient states for approximately 7 months off-and-on he has had recurrent episodes of epigastric abdominal pain followed by 2 to 3 days of nausea and vomiting.  These episodes are separated weeks at a time.  When they occur he will be in bed for multiple days.  Also notes profuse sweating as well as chills.  Symptoms not related with meals.  Denies any exposure to ticks.  Denies any symptoms associated with eating red meat or pork.  No dysphagia/odynophagia.  No melena hematochezia.  Intermittent ibuprofen use.  No previous upper endoscopy.    Does note acid reflux and heartburn as well.    Underwent EGD 09/20/2021 which was largely unremarkable besides gastritis.  Biopsies negative for H. pylori.  Currently on pantoprazole twice daily.  Chronic marijuana use.  Previously ondansetron which was helping some.  I switched him to promethazine which she states helps better but makes him tired.  Does tell me today that he has decreased his marijuana use significantly though continues to use.  Also stressed as his wife is currently in the hospital with pneumonia.  Does note 1 episode of diarrhea this morning.  Past Medical History:  Diagnosis Date   Bronchitis    GERD (gastroesophageal reflux disease)    Patient denies any previous surgeries  Current Outpatient Medications  Medication Sig Dispense Refill   acetaminophen (TYLENOL) 500 MG tablet Take 1,000-1,500 mg by mouth daily as needed for headache or moderate pain.     bismuth subsalicylate (PEPTO BISMOL) 262 MG/15ML suspension  Take 30 mLs by mouth every 6 (six) hours as needed for indigestion or diarrhea or loose stools.     Buprenorphine HCl-Naloxone HCl (SUBOXONE) 8-2 MG FILM Place 2 Film under the tongue daily. 60 each 1   DULoxetine (CYMBALTA) 30 MG capsule Take 2 capsules (60 mg total) by mouth daily. 60 capsule 5   gabapentin (NEURONTIN) 300 MG capsule Take 1 capsule (300 mg total) by mouth 3 (three) times daily. (Patient taking differently: Take 300 mg by mouth 2 (two) times daily.) 90 capsule 5   ibuprofen (ADVIL) 200 MG tablet Take 400-600 mg by mouth daily as needed for moderate pain or headache.     metoCLOPramide (REGLAN) 5 MG tablet Take 1 tablet (5 mg total) by mouth 4 (four) times daily. 120 tablet 1   ondansetron (ZOFRAN) 4 MG tablet Take 1 tablet (4 mg total) by mouth every 8 (eight) hours as needed for nausea or vomiting. 30 tablet 1   pantoprazole (PROTONIX) 40 MG tablet Take 1 tablet (40 mg total) by mouth 2 (two) times daily. 60 tablet 5   promethazine (PHENERGAN) 12.5 MG tablet Take 1 tablet (12.5 mg total) by mouth every 6 (six) hours as needed for nausea or vomiting. 30 tablet 0   senna (SENOKOT) 8.6 MG TABS tablet Take 1 tablet (8.6 mg total) by mouth daily. (Patient not taking: Reported on 09/14/2021) 90 tablet 3   No current facility-administered medications for this visit.  Allergies as of 10/06/2021   (No Known Allergies)    Family History  Problem Relation Age of Onset   Diabetes Mother    Heart disease Father    Prostate cancer Father     Social History   Socioeconomic History   Marital status: Married    Spouse name: Not on file   Number of children: Not on file   Years of education: Not on file   Highest education level: Not on file  Occupational History   Not on file  Tobacco Use   Smoking status: Never    Passive exposure: Current   Smokeless tobacco: Never  Vaping Use   Vaping Use: Never used  Substance and Sexual Activity   Alcohol use: Not Currently   Drug  use: Yes    Frequency: 7.0 times per week    Types: Marijuana   Sexual activity: Not on file  Other Topics Concern   Not on file  Social History Narrative   Not on file   Social Determinants of Health   Financial Resource Strain: Not on file  Food Insecurity: Not on file  Transportation Needs: Not on file  Physical Activity: Not on file  Stress: Not on file  Social Connections: Not on file  Intimate Partner Violence: Not on file    Subjective: Review of Systems  Constitutional:  Positive for chills and diaphoresis. Negative for fever.  HENT:  Negative for congestion and hearing loss.   Eyes:  Negative for blurred vision and double vision.  Respiratory:  Negative for cough and shortness of breath.   Cardiovascular:  Negative for chest pain and palpitations.  Gastrointestinal:  Positive for abdominal pain and heartburn. Negative for blood in stool, constipation, diarrhea, melena and vomiting.  Genitourinary:  Negative for dysuria and urgency.  Musculoskeletal:  Negative for joint pain and myalgias.  Skin:  Negative for itching and rash.  Neurological:  Negative for dizziness and headaches.  Psychiatric/Behavioral:  Negative for depression. The patient is not nervous/anxious.        Objective: BP (!) 157/97 (BP Location: Left Arm, Patient Position: Sitting, Cuff Size: Large)   Pulse 70   Temp 98.1 F (36.7 C) (Oral)   Ht '5\' 9"'$  (1.753 m)   Wt 204 lb 14.4 oz (92.9 kg)   BMI 30.26 kg/m  Physical Exam Constitutional:      Appearance: Normal appearance.  HENT:     Head: Normocephalic and atraumatic.  Eyes:     Extraocular Movements: Extraocular movements intact.     Conjunctiva/sclera: Conjunctivae normal.  Cardiovascular:     Rate and Rhythm: Normal rate and regular rhythm.  Pulmonary:     Effort: Pulmonary effort is normal.     Breath sounds: Normal breath sounds.  Abdominal:     General: Bowel sounds are normal.     Palpations: Abdomen is soft.   Musculoskeletal:        General: Normal range of motion.     Cervical back: Normal range of motion and neck supple.  Skin:    General: Skin is warm.  Neurological:     General: No focal deficit present.     Mental Status: He is alert and oriented to person, place, and time.  Psychiatric:        Mood and Affect: Mood normal.        Behavior: Behavior normal.      Assessment: *Epigastric abdominal pain *Nausea and vomiting  *GERD *Colon cancer screening *Hypokalemia  Plan:  Etiology of patient's nausea and vomiting likely due to cannabinoid hyperemesis syndrome.  Has decreased his use which I congratulated him on.  Recommended complete cessation.  Information printed out in regards to this previously.  In the meantime, I will send in metoclopramide 5 mg to take as needed.  I counseled on the small risk of tar dive dyskinesia like symptoms and he understands.  Continue on Protonix twice daily for chronic GERD.  Avoid NSAIDs.  Patient needs colonoscopy for colon cancer screening purposes though I am unsure if he would tolerate prep given his upper GI symptoms.  We will hold off for now.  Revisit on follow-up visit.  May consider further gallbladder work-up including HIDA scan in the future pending above work-up.  We will check BMP today given hypokalemia in the ER.  Follow-up in 6 to 8 weeks.  10/06/2021 11:16 AM   Disclaimer: This note was dictated with voice recognition software. Similar sounding words can inadvertently be transcribed and may not be corrected upon review.

## 2021-11-29 ENCOUNTER — Other Ambulatory Visit: Payer: Self-pay

## 2021-11-30 MED ORDER — BUPRENORPHINE HCL-NALOXONE HCL 8-2 MG SL FILM: 2.0000 | ORAL_FILM | Freq: Every day | SUBLINGUAL | 0 refills | Status: DC

## 2021-11-30 NOTE — Telephone Encounter (Signed)
Approved 30 day refill of suboxone.

## 2021-12-06 ENCOUNTER — Telehealth: Payer: Self-pay | Admitting: Radiology

## 2021-12-06 NOTE — Telephone Encounter (Signed)
Please see message from [place] office below and advise. OK to schedule?  [name], Edward L  [place], [place], [place]; [place], Lauren, RT He called and would like to schedule his left hip replacement.  His number is [phone].

## 2021-12-13 ENCOUNTER — Other Ambulatory Visit: Payer: Self-pay | Admitting: Orthopedic Surgery

## 2021-12-13 DIAGNOSIS — M5410 Radiculopathy, site unspecified: Secondary | ICD-10-CM

## 2021-12-15 ENCOUNTER — Encounter: Payer: Self-pay | Admitting: Student

## 2021-12-15 ENCOUNTER — Encounter: Payer: 59 | Admitting: Internal Medicine

## 2021-12-16 ENCOUNTER — Telehealth: Payer: Self-pay | Admitting: Radiology

## 2021-12-16 NOTE — Telephone Encounter (Signed)
We had previously discussed this patient. Per Dr. Lorin Mercy, ok to go ahead and schedule. Could you please call patient? Thanks.

## 2021-12-16 NOTE — Telephone Encounter (Signed)
Patient called Edward [name] office stating he is ready to schedule his hip surgery. Previous message sent to Dr. Lorin Mercy. Sherrie has blue sheet. Will speak with Dr. Lorin Mercy today to see if ok to schedule.

## 2021-12-17 ENCOUNTER — Telehealth: Payer: Self-pay | Admitting: Radiology

## 2021-12-17 NOTE — Telephone Encounter (Signed)
Spoke with [place] who was speaking to sister of patient's significant other when I was trying to reach them. Neither Crystal or Kamen were with her at that time. Sherrie advised she would be back in the office on Tuesday and would try and contact them then to schedule.

## 2021-12-17 NOTE — Telephone Encounter (Signed)
I called patient's number and left voice mail for return call.  I called sister in law, number that was left on my voice mail.  She will get message to patient and patient's girlfriend to call me back.  [phone]

## 2021-12-17 NOTE — Telephone Encounter (Signed)
Received voicemail from [employer], patient's significant other. She states that they are trying to get surgery scheduled and are unsure why they are getting the run around. She requests I call her back to discuss this and scheduling the surgery.  CB [phone]  I called, left voicemail requesting return call.

## 2021-12-31 ENCOUNTER — Telehealth: Payer: Self-pay | Admitting: *Deleted

## 2021-12-31 DIAGNOSIS — F1190 Opioid use, unspecified, uncomplicated: Secondary | ICD-10-CM

## 2021-12-31 NOTE — Telephone Encounter (Signed)
Patient called in with SO. They have been sharing films to save money. He is unable to work and recently lost insurance. Will need suboxone refill at [facility] under IM Program. Aware that they can only provide tabs, not films.   Last OV 5/30 with 2 month return request. No showed app 8/30. He has r/s for first available on 10/5 at 1015.

## 2022-01-03 ENCOUNTER — Other Ambulatory Visit (HOSPITAL_COMMUNITY): Payer: Self-pay

## 2022-01-03 MED ORDER — BUPRENORPHINE HCL-NALOXONE HCL 8-2 MG SL SUBL
1.0000 | SUBLINGUAL_TABLET | Freq: Two times a day (BID) | SUBLINGUAL | 0 refills | Status: AC
  Filled 2022-01-03 – 2022-02-03 (×2): qty 28, 14d supply, fill #0

## 2022-01-03 MED ORDER — BUPRENORPHINE HCL-NALOXONE HCL 8-2 MG SL SUBL
1.0000 | SUBLINGUAL_TABLET | Freq: Two times a day (BID) | SUBLINGUAL | 0 refills | Status: AC
  Filled 2022-01-03: qty 28, 14d supply, fill #0

## 2022-01-03 MED ORDER — BUPRENORPHINE HCL-NALOXONE HCL 8-2 MG SL FILM
2.0000 | ORAL_FILM | Freq: Every day | SUBLINGUAL | 0 refills | Status: DC
  Filled 2022-01-03: qty 60, 30d supply, fill #0

## 2022-01-03 NOTE — Addendum Note (Signed)
Addended by: Edwyna Perfect on: 01/03/2022 02:22 PM   Modules accepted: Orders

## 2022-01-03 NOTE — Addendum Note (Signed)
Addended by: Edwyna Perfect on: 01/03/2022 01:49 PM   Modules accepted: Orders

## 2022-01-03 NOTE — Telephone Encounter (Signed)
Last OV 09/14/21  UDS toxassure 4/23 appropriate UDS 6/23 +ve for cannabiniods Patient has follow-up 01/30/22. PDMP reviewed and appropriate. Will send in 60 tabs at this time. Patient will need updated Toxassure at follow-up 01/30/22.

## 2022-01-18 ENCOUNTER — Other Ambulatory Visit (HOSPITAL_COMMUNITY): Payer: Self-pay

## 2022-01-20 ENCOUNTER — Other Ambulatory Visit (HOSPITAL_COMMUNITY): Payer: Self-pay

## 2022-01-20 ENCOUNTER — Ambulatory Visit (INDEPENDENT_AMBULATORY_CARE_PROVIDER_SITE_OTHER): Payer: Self-pay | Admitting: Internal Medicine

## 2022-01-20 VITALS — BP 126/90 | HR 70 | Wt 217.1 lb

## 2022-01-20 DIAGNOSIS — R112 Nausea with vomiting, unspecified: Secondary | ICD-10-CM

## 2022-01-20 DIAGNOSIS — M5410 Radiculopathy, site unspecified: Secondary | ICD-10-CM

## 2022-01-20 DIAGNOSIS — F1190 Opioid use, unspecified, uncomplicated: Secondary | ICD-10-CM

## 2022-01-20 DIAGNOSIS — F1290 Cannabis use, unspecified, uncomplicated: Secondary | ICD-10-CM

## 2022-01-20 DIAGNOSIS — R69 Illness, unspecified: Secondary | ICD-10-CM | POA: Diagnosis not present

## 2022-01-20 DIAGNOSIS — Z0000 Encounter for general adult medical examination without abnormal findings: Secondary | ICD-10-CM | POA: Insufficient documentation

## 2022-01-20 MED ORDER — GABAPENTIN 300 MG PO CAPS
300.0000 mg | ORAL_CAPSULE | Freq: Two times a day (BID) | ORAL | 5 refills | Status: DC
  Filled 2022-01-20 – 2022-02-03 (×2): qty 60, 30d supply, fill #0

## 2022-01-20 MED ORDER — BUPRENORPHINE HCL-NALOXONE HCL 8-2 MG SL FILM
1.0000 | ORAL_FILM | Freq: Two times a day (BID) | SUBLINGUAL | 1 refills | Status: DC
  Filled 2022-01-20: qty 28, 14d supply, fill #0

## 2022-01-20 NOTE — Assessment & Plan Note (Signed)
Patient smokes marijuana daily. Currently takes Reglan for nausea with good control of symptoms. Plan:Continue Reglan.

## 2022-01-20 NOTE — Assessment & Plan Note (Signed)
New prescription sent to [facility] under IM Program for patient's gabapentin due to current uninsured status.

## 2022-01-20 NOTE — Progress Notes (Signed)
   CC: medication refill  HPI:  [name] is a 51 y.o. person with past medical history as detailed below who presents today for medication refill and check-up. Please see problem based charting for detailed assessment and plan.  Past Medical History:  Diagnosis Date   Bronchitis    GERD (gastroesophageal reflux disease)    Review of Systems:  Negative unless otherwise stated.  Physical Exam:  Vitals:   01/20/22 1028  BP: (!) 126/90  Pulse: 70  SpO2: 99%  Weight: 217 lb 1.6 oz (98.5 kg)   Constitutional:Well appearing, in no acute distress. Cardio:Regular rate and rhythm. No murmurs, rubs, or gallops. Pulm:Clear to auscultation bilaterally. Normal work of breathing on room air. DGU:[id number] for extremity edema. Skin:Warm and dry. Neuro:Alert and oriented x3. No focal deficit noted. Psych:Pleasant mood and affect.  Assessment & Plan:   See Encounters Tab for problem based charting.  Cannabinoid hyperemesis syndrome Patient smokes marijuana daily. Currently takes Reglan for nausea with good control of symptoms. Plan:Continue Reglan.  Opioid use disorder Patient had previously been treated with Suboxone, 8-2 mg BID and was doing well however recently his previous insurance carrier closed down and he has been uninsured as a result. He has not been able to afford his medications including Suboxone. In this time he has been taking a friend's Suboxone, 1/2 to 1 strip daily as he was able to help with his pain. He states that he has applied for insurance and that should come through soon.  He denies using illicit substances except marijuana, which he smokes daily. Last ToxAssure with expected results, plus THC. He states that I should expect to see these same results.  Plan:Due to financial constraints, I have sent in a 14 day supply of Suboxone 8-2 mg BID as this will be $25, with one refill. Will plan to follow-up in 1 month. Hopefully he is insured at that time and more  consistent fills can be sent in. If he is doing well after being re-established on Suboxone, can consider spacing time between visits to 2 months. ToxAssure obtained today.  Radicular pain of left lower extremity New prescription sent to [facility] under IM Program for patient's gabapentin due to current uninsured status.  Healthcare maintenance Patient is due for colonoscopy, tetanus booster at this time. I would also recommend recheck of metabolic panel as last check was in June. Please plan to address these concerns again at next OV once patient is insured again.  Patient discussed with Dr. Dareen Piano

## 2022-01-20 NOTE — Patient Instructions (Addendum)
[name],  It was a pleasure to care for you today. I am glad that you are doing well!  I have sent in a refill of your Suboxone so that when your insurance comes through, it is available for you. I have also sent a prescription for your gabapentin to the [facility] on [facility] as it will be cheaper there while you remain uninsured.  Please plan to follow up in 1 months for your Suboxone. If you need our clinic prior to that time, do not hesitate to call.  My best, Dr. Marlou Sa

## 2022-01-20 NOTE — Assessment & Plan Note (Signed)
Patient is due for colonoscopy, tetanus booster at this time. I would also recommend recheck of metabolic panel as last check was in June. Please plan to address these concerns again at next OV once patient is insured again.

## 2022-01-20 NOTE — Assessment & Plan Note (Signed)
Patient had previously been treated with Suboxone, 8-2 mg BID and was doing well however recently his previous insurance carrier closed down and he has been uninsured as a result. He has not been able to afford his medications including Suboxone. In this time he has been taking a friend's Suboxone, 1/2 to 1 strip daily as he was able to help with his pain. He states that he has applied for insurance and that should come through soon.  He denies using illicit substances except marijuana, which he smokes daily. Last ToxAssure with expected results, plus THC. He states that I should expect to see these same results.  Plan:Due to financial constraints, I have sent in a 14 day supply of Suboxone 8-2 mg BID as this will be $25, with one refill. Will plan to follow-up in 1 month. Hopefully he is insured at that time and more consistent fills can be sent in. If he is doing well after being re-established on Suboxone, can consider spacing time between visits to 2 months. ToxAssure obtained today.

## 2022-01-21 ENCOUNTER — Other Ambulatory Visit (HOSPITAL_COMMUNITY): Payer: Self-pay

## 2022-01-21 MED ORDER — BUPRENORPHINE HCL-NALOXONE HCL 8-2 MG SL SUBL
1.0000 | SUBLINGUAL_TABLET | Freq: Two times a day (BID) | SUBLINGUAL | 1 refills | Status: DC
  Filled 2022-01-21: qty 14, 7d supply, fill #0

## 2022-01-21 NOTE — Addendum Note (Signed)
Addended by: Renato Battles on: 01/21/2022 08:57 AM   Modules accepted: Orders

## 2022-01-21 NOTE — Addendum Note (Signed)
Addended by: Renato Battles on: 01/21/2022 09:21 AM   Modules accepted: Orders

## 2022-01-22 LAB — TOXASSURE SELECT,+ANTIDEPR,UR

## 2022-01-24 ENCOUNTER — Encounter: Payer: Self-pay | Admitting: Internal Medicine

## 2022-01-28 ENCOUNTER — Other Ambulatory Visit (HOSPITAL_COMMUNITY): Payer: Self-pay

## 2022-01-31 NOTE — Progress Notes (Signed)
Internal Medicine Clinic Attending ? ?Case discussed with Dr. Dean  At the time of the visit.  We reviewed the resident?s history and exam and pertinent patient test results.  I agree with the assessment, diagnosis, and plan of care documented in the resident?s note.  ?

## 2022-02-03 ENCOUNTER — Other Ambulatory Visit (HOSPITAL_COMMUNITY): Payer: Self-pay

## 2022-02-04 ENCOUNTER — Other Ambulatory Visit (HOSPITAL_COMMUNITY): Payer: Self-pay

## 2022-02-07 ENCOUNTER — Encounter: Payer: Self-pay | Admitting: Student

## 2022-02-07 ENCOUNTER — Telehealth: Payer: Self-pay | Admitting: Student

## 2022-02-07 NOTE — Telephone Encounter (Signed)
Pt's wife calling to report the patient is having a hard time breathing, coughing and having headaches.

## 2022-02-07 NOTE — Telephone Encounter (Signed)
RTC to patient given an  appointment for this afternoon at 3:15 PM.  Patient was advised to wear a mask when he come in today as he has not gotten a Covid Test.

## 2022-02-07 NOTE — Telephone Encounter (Signed)
Pt's (SO ) Crystal has called back to give a new number listed [phone] .  Please call this number back.

## 2022-02-07 NOTE — Telephone Encounter (Signed)
RTC to patient has a cough with some Nausea and vomiting.   Coughing up Clear to brown mucous.  Vomits up clear to yellow mucous.  Has a headache has been going on x 2 days.  Has been around folks with URI.  Has not taken a Covid Test.  Will try and get 1 today.  Will call back with results.  Given an appointment with Dr. Lisabeth Devoid for tomorrow afternoon.  Told to go to an Urgent Care if symptoms worsen before tomorrow's visit.

## 2022-02-08 ENCOUNTER — Encounter: Payer: Self-pay | Admitting: Student

## 2022-02-08 NOTE — Telephone Encounter (Signed)
Looks like he canceled his appointment today. Did he get seen elsewhere for start feeling better?

## 2022-02-08 NOTE — Telephone Encounter (Signed)
Call to patient .  Message left on VM that Clinics had called to see why he did not keep appointment scheduled for today.

## 2022-02-14 ENCOUNTER — Other Ambulatory Visit: Payer: Self-pay | Admitting: Internal Medicine

## 2022-02-18 ENCOUNTER — Ambulatory Visit (INDEPENDENT_AMBULATORY_CARE_PROVIDER_SITE_OTHER): Payer: 59 | Admitting: Internal Medicine

## 2022-02-18 VITALS — BP 140/82 | HR 66 | Wt 212.1 lb

## 2022-02-18 DIAGNOSIS — D1721 Benign lipomatous neoplasm of skin and subcutaneous tissue of right arm: Secondary | ICD-10-CM

## 2022-02-18 DIAGNOSIS — F32A Depression, unspecified: Secondary | ICD-10-CM

## 2022-02-18 DIAGNOSIS — F419 Anxiety disorder, unspecified: Secondary | ICD-10-CM

## 2022-02-18 DIAGNOSIS — Z23 Encounter for immunization: Secondary | ICD-10-CM

## 2022-02-18 DIAGNOSIS — F1190 Opioid use, unspecified, uncomplicated: Secondary | ICD-10-CM | POA: Diagnosis not present

## 2022-02-18 DIAGNOSIS — R69 Illness, unspecified: Secondary | ICD-10-CM | POA: Diagnosis not present

## 2022-02-18 DIAGNOSIS — I10 Essential (primary) hypertension: Secondary | ICD-10-CM | POA: Diagnosis not present

## 2022-02-18 DIAGNOSIS — D179 Benign lipomatous neoplasm, unspecified: Secondary | ICD-10-CM | POA: Insufficient documentation

## 2022-02-18 MED ORDER — BUPRENORPHINE HCL-NALOXONE HCL 8-2 MG SL SUBL: 1.0000 | SUBLINGUAL_TABLET | Freq: Two times a day (BID) | SUBLINGUAL | 0 refills | Status: DC

## 2022-02-18 MED ORDER — DULOXETINE HCL 30 MG PO CPEP: 60.0000 mg | ORAL_CAPSULE | Freq: Every day | ORAL | 3 refills | Status: DC

## 2022-02-18 NOTE — Patient Instructions (Addendum)
Thank you, Edward [name] for allowing [place] to provide your care today.   I have sent in 60 tablets of [facility] [place]. Please come back in one month.  I also refilled cymbalta in 3 month increments.  Thank you for getting tetanus vaccine today.  Your blood pressure was high today. If this remains elevated we may need to consider medications at the next visit. Please try to get more exercise and this has been shown to help with blood pressure. If you have a blood pressure cuff at home please check this 1 time daily and record values. Bring that log in at follow-up visit.   I have ordered the following labs for you:   Lab Orders         ToxAssure Select,+Antidepr,UR      I have ordered the following medication/changed the following medications:   Stop the following medications: Medications Discontinued During This Encounter  Medication Reason   DULoxetine (CYMBALTA) 30 MG capsule Reorder   buprenorphine-naloxone (SUBOXONE) 8-2 mg SUBL SL tablet Reorder     Start the following medications: Meds ordered this encounter  Medications   DULoxetine (CYMBALTA) 30 MG capsule    Sig: Take 2 capsules (60 mg total) by mouth daily.    Dispense:  180 capsule    Refill:  3   buprenorphine-naloxone (SUBOXONE) 8-2 mg SUBL SL tablet    Sig: Place 1 tablet under the tongue in the morning and at bedtime.    Dispense:  60 tablet    Refill:  0    IM Suboxone     Follow up:  1 month    We look forward to seeing you next time. Please call our clinic at [phone] if you have any questions or concerns. The best time to call is Monday-Friday from 9am-4pm, but there is someone available 24/7. If after hours or the weekend, call the main hospital number and ask for the Internal Medicine Resident On-Call. If you need medication refills, please notify your pharmacy one week in advance and they will send [place] a request.   Thank you for trusting me with your care. Wishing you the best!   Christiana Fuchs, [place]

## 2022-02-18 NOTE — Assessment & Plan Note (Signed)
PHQ9 improved at 0 today from 9 in May. He reports adherence to cymbalta. He would like to continue  medication and for it to be sent in 90 day increments. -[facility]

## 2022-02-18 NOTE — Assessment & Plan Note (Signed)
Patient reports that his wife noticed something on his back and would like him to ask about it. He denies pain or decreased function due to this. He does not notice this at all. Assessment: Exam consistent with lipoma. I talked with him about diagnosis and he was reassured.

## 2022-02-18 NOTE — Assessment & Plan Note (Signed)
Blood pressure elevated at 135/80 to 140/82 on repeat.  P: -follow-up in 4 weeks with blood pressure log.

## 2022-02-18 NOTE — Progress Notes (Signed)
Subjective:  CC: follow-up for OUD, lump on back  HPI:  [name] is a 51 y.o. male with a past medical history stated below and presents today for follow-up on OUD. Please see problem based assessment and plan for additional details.  Past Medical History:  Diagnosis Date   Bronchitis    GERD (gastroesophageal reflux disease)     Current Outpatient Medications on File Prior to Visit  Medication Sig Dispense Refill   acetaminophen (TYLENOL) 500 MG tablet Take 1,000-1,500 mg by mouth daily as needed for headache or moderate pain.     gabapentin (NEURONTIN) 300 MG capsule Take 1 capsule (300 mg total) by mouth 2 (two) times daily. 90 capsule 5   ibuprofen (ADVIL) 200 MG tablet Take 400-600 mg by mouth daily as needed for moderate pain or headache.     metoCLOPramide (REGLAN) 5 MG tablet Take 1 tablet (5 mg total) by mouth 4 (four) times daily. 120 tablet 1   pantoprazole (PROTONIX) 40 MG tablet Take 1 tablet (40 mg total) by mouth 2 (two) times daily. 60 tablet 5   No current facility-administered medications on file prior to visit.    Family History  Problem Relation Age of Onset   Diabetes Mother    Heart disease Father    Prostate cancer Father     Social History   Socioeconomic History   Marital status: Married    Spouse name: Not on file   Number of children: Not on file   Years of education: Not on file   Highest education level: Not on file  Occupational History   Not on file  Tobacco Use   Smoking status: Never    Passive exposure: Current   Smokeless tobacco: Never  Vaping Use   Vaping Use: Never used  Substance and Sexual Activity   Alcohol use: Not Currently   Drug use: Yes    Frequency: 7.0 times per week    Types: Marijuana   Sexual activity: Not on file  Other Topics Concern   Not on file  Social History Narrative   Not on file   Social Determinants of Health   Financial Resource Strain: Not on file  Food Insecurity: Not on file   Transportation Needs: Not on file  Physical Activity: Not on file  Stress: Not on file  Social Connections: Not on file  Intimate Partner Violence: Not on file    Review of Systems: ROS negative except for what is noted on the assessment and plan.  Objective:   Vitals:   02/18/22 0926 02/18/22 0950  BP: 135/80 (!) 140/82  Pulse: 73 66  SpO2: 98%   Weight: 212 lb 1.6 oz (96.2 kg)     Physical Exam: Constitutional: well-appearing  Cardiovascular: regular rate and rhythm, no m/r/g Pulmonary/Chest: normal work of breathing on room air, lungs clear to auscultation bilaterally Abdominal: soft, non-tender, non-distended MSK: normal bulk and tone, mobile round lump medial to right scapula, full range of motion of shoulder bilaterally Skin: warm and dry Psych: normal mood and affect     Assessment & Plan:  Opioid use disorder Patient with history of OUD. He is prescribed 60 tablets per month. He states that he takes 1 tablet in the morning and then breaks other dose in 1/2 at lunch and dinner. He reports that his cravings are well controlled on current dosing. He previously using heroin and meth but has not used in over 2 years. He now has insurance  and requests that [facility] [place]. P: -PDMP reviewed and appropriate -suboxone 8/2 mg BID, 60 tabs [facility] [place] -toxassure -follow-up in 1 month  Anxiety and depression PHQ9 improved at 0 today from 9 in May. He reports adherence to cymbalta. He would like to continue  medication and for it to be sent in 90 day increments. -Cymbalta 60 mg [facility]  Lipoma Patient reports that his wife noticed something on his back and would like him to ask about it. He denies pain or decreased function due to this. He does not notice this at all. Assessment: Exam consistent with lipoma. I talked with him about diagnosis and he was reassured.  Hypertension Blood pressure elevated at 135/80 to 140/82 on repeat.  P: -follow-up  in 4 weeks with blood pressure log.     Patient discussed with Dr. Brent General Ranon Coven, D.O. [facility]  PGY-2 Pager: [phone]  Phone: [phone] Date 02/18/2022  Time 4:27 PM

## 2022-02-18 NOTE — Assessment & Plan Note (Signed)
Patient with history of OUD. He is prescribed 60 tablets per month. He states that he takes 1 tablet in the morning and then breaks other dose in 1/2 at lunch and dinner. He reports that his cravings are well controlled on current dosing. He previously using heroin and meth but has not used in over 2 years. He now has insurance and requests that [facility] [place]. P: -PDMP reviewed and appropriate -suboxone 8/2 mg BID, 60 tabs [facility] [place] -tox[facility]ure -follow-up in 1 month

## 2022-02-19 NOTE — Progress Notes (Signed)
Internal Medicine Clinic Attending  Case discussed with Dr. Masters  At the time of the visit.  We reviewed the resident's history and exam and pertinent patient test results.  I agree with the assessment, diagnosis, and plan of care documented in the resident's note.  

## 2022-02-22 ENCOUNTER — Other Ambulatory Visit: Payer: Self-pay | Admitting: Internal Medicine

## 2022-02-23 ENCOUNTER — Telehealth: Payer: Self-pay

## 2022-02-23 ENCOUNTER — Other Ambulatory Visit: Payer: Self-pay | Admitting: Internal Medicine

## 2022-02-23 MED ORDER — PANTOPRAZOLE SODIUM 40 MG PO TBEC: 40.0000 mg | DELAYED_RELEASE_TABLET | Freq: Every day | ORAL | 11 refills | Status: DC

## 2022-02-23 MED ORDER — PROMETHAZINE HCL 12.5 MG PO TABS: 12.5000 mg | ORAL_TABLET | Freq: Four times a day (QID) | ORAL | 2 refills | Status: DC | PRN

## 2022-02-23 NOTE — Telephone Encounter (Signed)
Pt is requesting an rx for promethazine 12.5 mg to be sent to the pharmacy. Pt has a follow up appt with Magda Paganini on 03/07/22. Pt is also needing the rx for pantoprazole 40 mg [facility] [place] [facility] in [place]. Pharmacy has been updated in the patient's chart.

## 2022-02-23 NOTE — Telephone Encounter (Signed)
Pt was made aware.  

## 2022-02-23 NOTE — Telephone Encounter (Signed)
Phenergan and pantoprazole sent to pharmacy

## 2022-02-24 ENCOUNTER — Ambulatory Visit (INDEPENDENT_AMBULATORY_CARE_PROVIDER_SITE_OTHER): Payer: 59 | Admitting: Orthopaedic Surgery

## 2022-02-24 ENCOUNTER — Ambulatory Visit (INDEPENDENT_AMBULATORY_CARE_PROVIDER_SITE_OTHER): Payer: 59

## 2022-02-24 ENCOUNTER — Encounter: Payer: Self-pay | Admitting: Orthopaedic Surgery

## 2022-02-24 VITALS — Ht 69.0 in | Wt 212.0 lb

## 2022-02-24 DIAGNOSIS — M25552 Pain in left hip: Secondary | ICD-10-CM

## 2022-02-24 DIAGNOSIS — M87052 Idiopathic aseptic necrosis of left femur: Secondary | ICD-10-CM

## 2022-02-24 LAB — TOXASSURE SELECT,+ANTIDEPR,UR

## 2022-02-24 NOTE — Progress Notes (Addendum)
Office Visit Note   Patient: Edward [name]           Date of Birth: [birth year]           MRN: [id number] Visit Date: 02/24/2022              Requested by: [name], MD [address] [place],  [place] [address] PCP: [name], MD   Assessment & Plan: Visit Diagnoses:  1. Pain in left hip   2. Avascular necrosis of left femoral head ([place])     Plan: Patient has pain following avascular necrosis left hip  preventing him from working.   2.3 X2.4 X 0.8 cm area involved. Marginal osteophytes about the hip joint with joint space narrowing. Degenerative changes on the acetabulum on MRI>  Plan total hip arthroplasty direct anterior approach overnight stay in the hospital.  We discussed likely spinal anesthesia, postop use of a walker.  Follow-Up Instructions: No follow-ups on file.   Orders:  Orders Placed This Encounter  Procedures   XR HIP UNILAT W OR W/O PELVIS 2-3 VIEWS LEFT   No orders of the defined types were placed in this encounter.     Procedures: No procedures performed   Clinical Data: No additional findings.   Subjective: Chief Complaint  Patient presents with   Left Hip - Pain, Follow-up    HPI 51 year old male with painful AVN left hip.  He has had MRI scan documenting.  He was scheduled for total hip arthroplasty a few months ago but his insurance company left the state.  He states he is now on [employer] and hip pain is gotten worse more limping and pain is severe enough he is not able to work.  Patient states he is ready to proceed with scheduling total of arthroplasty.  Patient has history of low back pain, anxiety depression and hypertension.  Patient is here with his wife.  Patient is on the gabapentin 300 mg p.o. twice daily.  Also on Suboxone for his hip pain. Left groin pain with standing and walking radiating down to just above the knee.  Patient works as a Curator and was referred to me by Dr. Ples Specter for direct anterior approach for total of  arthroplasty for his avascular necrosis of the hip.  Area involved in the left hip is 2.3 x 2.4 cm with degenerative changes on the acetabular side as well.  Originally scheduled for surgery back in March but states his surgery had to be delayed a couple months and then his insurance company left the state.Failed cane, NSAIDs Tx.   Review of Systems positive for opioid use disorder on Suboxone for his  bilateral hip AVN pain.   Objective: Vital Signs: Ht '5\' 9"'$  (1.753 m)   Wt 212 lb (96.2 kg)   BMI 31.31 kg/m   Physical Exam Constitutional:      Appearance: He is well-developed.  HENT:     Head: Normocephalic and atraumatic.     Right Ear: External ear normal.     Left Ear: External ear normal.  Eyes:     Pupils: Pupils are equal, round, and reactive to light.  Neck:     Thyroid: No thyromegaly.     Trachea: No tracheal deviation.  Cardiovascular:     Rate and Rhythm: Normal rate.  Pulmonary:     Effort: Pulmonary effort is normal.     Breath sounds: No wheezing.  Abdominal:     General: Bowel sounds are normal.  Palpations: Abdomen is soft.  Musculoskeletal:     Cervical back: Neck supple.  Skin:    General: Skin is warm and dry.     Capillary Refill: Capillary refill takes less than 2 seconds.  Neurological:     Mental Status: He is alert and oriented to person, place, and time.  Psychiatric:        Behavior: Behavior normal.        Thought Content: Thought content normal.        Judgment: Judgment normal.     Ortho Exam no pain with internal rotation left hip leg lengths are equal.  He is ambulatory with positive Trendelenburg gait on the left.  Pulses are intact.Pain with standing , pulses normal , no hip flexion contracture.   Specialty Comments:  No specialty comments available.  Imaging: Narrative & Impression  CLINICAL DATA:  Hip pain. Osteonecrosis suspected. Lower back pain and bilateral hip pain for over 9 months.   EXAM: MR OF THE RIGHT HIP  WITHOUT CONTRAST   TECHNIQUE: Multiplanar, multisequence MR imaging was performed. No intravenous contrast was administered.   COMPARISON:  AP pelvis 06/07/2021, CT abdomen and pelvis 05/24/2021   FINDINGS: Bones: As seen on prior CT, there is serpiginous predominantly sclerotic decreased T1 and decreased T2 signal with areas of patchy peripheral increased T2 signal likely healing granulation tissue within the anterior superior right-greater-than-left femoral heads. This is seen in a region measuring up to 2.3 x 2.4 x 0.8 cm in greatest transverse by AP by craniocaudal measurements on the right and 2.2 x 1.8 x 0.7 cm on the left. There is no surrounding marrow edema. This is consistent with early avascular necrosis. No overlying cortical collapse is seen.   Mild joint space narrowing and peripheral osteophytosis degenerative changes of the pubic symphysis with moderate right and mild left pubic body subchondral marrow edema.   Articular cartilage and labrum   Articular cartilage: Mild-to-moderate bilateral anterior superior femoral head and acetabular cartilage thinning.   Labrum: Minimal bilateral anterior superior acetabular degenerative attenuation.   Joint or bursal effusion   Joint effusion:  No joint effusion within either hip.   Bursae: No trochanteric bursitis on either side.   Muscles and tendons   Muscles and tendons: The origins of the bilateral rectus femoris tendons and the bilateral common hamstring tendon origins are intact. The insertions of the bilateral gluteus minimus gluteus medius and iliopsoas tendons are intact. The rectus abdominis-adductor aponeuroses are intact.   Other findings   Miscellaneous: No significant abnormality is seen within the intrapelvic soft tissues.   IMPRESSION:: IMPRESSION: 1. Mild-to-moderate right greater than left avascular necrosis of the anterior superior femoral heads. No overlying cortical collapse. No  significant acute marrow edema. 2. Mild pubic symphysis osteoarthritis with right-greater-than-left subchondral marrow edema suggesting this may represent a source of pain. 3. Mild-to-moderate bilateral anterior superior femoral head and acetabular cartilage thinning.     Electronically Signed   By: Yvonne Kendall M.D.   On: 06/21/2021 18:10       PMFS History: Patient Active Problem List   Diagnosis Date Noted   Lipoma 02/18/2022   Hypertension 02/18/2022   Radicular pain of left lower extremity 01/20/2022   Healthcare maintenance 01/20/2022   Cannabinoid hyperemesis syndrome 10/06/2021   Abdominal pain, chronic, epigastric 10/06/2021   GERD (gastroesophageal reflux disease) 10/06/2021   Avascular necrosis of femoral head ([place]) 06/15/2021   Opioid use disorder 05/19/2021   Chronic lower back pain 05/19/2021  Anxiety and depression 05/19/2021   Past Medical History:  Diagnosis Date   Bronchitis    GERD (gastroesophageal reflux disease)     Family History  Problem Relation Age of Onset   Diabetes Mother    Heart disease Father    Prostate cancer Father     Past Surgical History:  Procedure Laterality Date   BIOPSY  09/20/2021   Procedure: BIOPSY;  Surgeon: [name], DO;  [facility];  Service: Endoscopy;;   ESOPHAGOGASTRODUODENOSCOPY (EGD) WITH PROPOFOL N/A 09/20/2021   Procedure: ESOPHAGOGASTRODUODENOSCOPY (EGD) WITH PROPOFOL;  Surgeon: [name], DO;  [facility];  Service: Endoscopy;  Laterality: N/A;  12:15pm   NO PAST SURGERIES     Social History   Occupational History   Not on file  Tobacco Use   Smoking status: Never    Passive exposure: Current   Smokeless tobacco: Never  Vaping Use   Vaping Use: Never used  Substance and Sexual Activity   Alcohol use: Not Currently   Drug use: Yes    Frequency: 7.0 times per week    Types: Marijuana   Sexual activity: Not on file

## 2022-03-07 ENCOUNTER — Ambulatory Visit (INDEPENDENT_AMBULATORY_CARE_PROVIDER_SITE_OTHER): Payer: 59 | Admitting: Gastroenterology

## 2022-03-07 ENCOUNTER — Encounter: Payer: Self-pay | Admitting: Gastroenterology

## 2022-03-07 ENCOUNTER — Other Ambulatory Visit (HOSPITAL_COMMUNITY): Payer: Self-pay

## 2022-03-07 ENCOUNTER — Encounter: Payer: Self-pay | Admitting: *Deleted

## 2022-03-07 VITALS — BP 123/70 | HR 70 | Temp 98.0°F | Ht 69.0 in | Wt 212.2 lb

## 2022-03-07 DIAGNOSIS — K219 Gastro-esophageal reflux disease without esophagitis: Secondary | ICD-10-CM

## 2022-03-07 DIAGNOSIS — Z1211 Encounter for screening for malignant neoplasm of colon: Secondary | ICD-10-CM

## 2022-03-07 DIAGNOSIS — R112 Nausea with vomiting, unspecified: Secondary | ICD-10-CM | POA: Insufficient documentation

## 2022-03-07 MED ORDER — METOCLOPRAMIDE HCL 5 MG PO TABS: 5.0000 mg | ORAL_TABLET | Freq: Four times a day (QID) | ORAL | 0 refills | Status: DC | PRN

## 2022-03-07 MED ORDER — ONDANSETRON HCL 4 MG PO TABS: 4.0000 mg | ORAL_TABLET | Freq: Three times a day (TID) | ORAL | 0 refills | Status: DC | PRN

## 2022-03-07 MED ORDER — PANTOPRAZOLE SODIUM 40 MG PO TBEC: 40.0000 mg | DELAYED_RELEASE_TABLET | Freq: Two times a day (BID) | ORAL | 11 refills | Status: DC

## 2022-03-07 MED ORDER — PEG 3350-KCL-NA BICARB-NACL 420 G PO SOLR: 4000.0000 mL | Freq: Once | ORAL | 0 refills | Status: AC

## 2022-03-07 NOTE — Progress Notes (Signed)
GI Office Note    Referring Provider: [name], MD Primary Care Physician:  [name], MD  Primary Gastroenterologist: Elon Alas. Abbey Chatters, DO   Chief Complaint   Chief Complaint  Patient presents with   Follow-up    Still having nausea/vomiting    History of Present Illness   Edward [name] is a 51 y.o. male presenting today for follow-up.  Last seen in June 2023.  81-monthhistory of intermittent episodes of epigastric pain associated with nausea/vomiting.  Episodes separated weeks at a time.  Associated with profuse sweating as well as chills.  EGD fairly unremarkable.  Symptoms felt to be related to cannabinoid hyperemesis syndrome.  Consider further gallbladder work-up in the future if ongoing symptoms.  Recommended colonoscopy after next office visit.  Presents today with complaints of vomiting up thick saliva and acid all the time. No food coming up. Hot/cold chills not as bad. May go a week at a time without symptoms but then be sick for days. States he quit marijuana for 10 weeks and felt like symptoms got worse. Currently smoking marijuana 3-4 times per day. Taking reglan BID. Zofran when flares. Denies abdominal pain. Scheduled for left hip replacement on 03/25/22 and would like to try and complete colonoscopy before. BMs normal. No melena, brbpr. Heartburn 1-2 times per week, nocturnal.  06/2021: 230 pounds 08/2021: 218 pounds 09/2021: 204 pounds Today: 212 pounds     EGD 09/2021 - Z-line regular, 42 cm from the incisors. - Gastritis. Biopsied. - Normal duodenal bulb, first portion of the duodenum and second portion of the duodenum. -gastric mucosa benign with no H.pylori  CT abdomen pelvis with contrast June 2023: Diffuse bladder wall thickening possibly cystitis.  Pancreas unremarkable.  Medications   Current Outpatient Medications  Medication Sig Dispense Refill   acetaminophen (TYLENOL) 500 MG tablet Take 1,000-1,500 mg by mouth daily as needed for  headache or moderate pain.     buprenorphine-naloxone (SUBOXONE) 8-2 mg SUBL SL tablet Place 1 tablet under the tongue in the morning and at bedtime. 60 tablet 0   DULoxetine (CYMBALTA) 30 MG capsule Take 2 capsules (60 mg total) by mouth daily. 180 capsule 3   gabapentin (NEURONTIN) 300 MG capsule Take 1 capsule (300 mg total) by mouth 2 (two) times daily. 90 capsule 5   ibuprofen (ADVIL) 200 MG tablet Take 400-600 mg by mouth daily as needed for moderate pain or headache.     metoCLOPramide (REGLAN) 5 MG tablet Take 1 tablet (5 mg total) by mouth 4 (four) times daily. 120 tablet 1   ondansetron (ZOFRAN) 4 MG tablet Take 4 mg by mouth every 8 (eight) hours as needed.     pantoprazole (PROTONIX) 40 MG tablet Take 1 tablet (40 mg total) by mouth daily. 30 tablet 11   No current facility-administered medications for this visit.    Allergies   Allergies as of 03/07/2022   (No Known Allergies)     Past Medical History   Past Medical History:  Diagnosis Date   Bronchitis    GERD (gastroesophageal reflux disease)     Past Surgical History   Past Surgical History:  Procedure Laterality Date   BIOPSY  09/20/2021   Procedure: BIOPSY;  Surgeon: CEloise Harman DO;  [facility];  Service: Endoscopy;;   ESOPHAGOGASTRODUODENOSCOPY (EGD) WITH PROPOFOL N/A 09/20/2021   Procedure: ESOPHAGOGASTRODUODENOSCOPY (EGD) WITH PROPOFOL;  Surgeon: CEloise Harman DO;  [facility];  Service: Endoscopy;  Laterality: N/A;  12:15pm   NO PAST SURGERIES      Past Family History   Family History  Problem Relation Age of Onset   Diabetes Mother    Heart disease Father    Prostate cancer Father     Past Social History   Social History   Socioeconomic History   Marital status: Married    Spouse name: Not on file   Number of children: Not on file   Years of education: Not on file   Highest education level: Not on file  Occupational History   Not on file  Tobacco Use    Smoking status: Never    Passive exposure: Current   Smokeless tobacco: Never  Vaping Use   Vaping Use: Never used  Substance and Sexual Activity   Alcohol use: Not Currently   Drug use: Yes    Frequency: 7.0 times per week    Types: Marijuana   Sexual activity: Yes  Other Topics Concern   Not on file  Social History Narrative   Not on file   Social Determinants of Health   Financial Resource Strain: Not on file  Food Insecurity: Not on file  Transportation Needs: Not on file  Physical Activity: Not on file  Stress: Not on file  Social Connections: Not on file  Intimate Partner Violence: Not on file    Review of Systems   General: Negative for anorexia,  fever, chills, fatigue, weakness. See hpi ENT: Negative for hoarseness, difficulty swallowing , nasal congestion. CV: Negative for chest pain, angina, palpitations, dyspnea on exertion, peripheral edema.  Respiratory: Negative for dyspnea at rest, dyspnea on exertion, cough, sputum, wheezing.  GI: See history of present illness. GU:  Negative for dysuria, hematuria, urinary incontinence, urinary frequency, nocturnal urination.  Endo: Negative for unusual weight change.     Physical Exam   BP 123/70 (BP Location: Right Arm, Patient Position: Sitting, Cuff Size: Large)   Pulse 70   Temp 98 F (36.7 C) (Oral)   Ht '5\' 9"'$  (1.753 m)   Wt 212 lb 3.2 oz (96.3 kg)   SpO2 98%   BMI 31.34 kg/m    General: Well-nourished, well-developed in no acute distress.  Eyes: No icterus. Mouth: Oropharyngeal mucosa moist and pink , no lesions erythema or exudate. Abdomen: Bowel sounds are normal, nontender, nondistended, no hepatosplenomegaly or masses,  no abdominal bruits or hernia , no rebound or guarding.  Rectal: not performed Extremities: No lower extremity edema. No clubbing or deformities. Neuro: Alert and oriented x 4   Skin: Warm and dry, no jaundice.   Psych: Alert and cooperative, normal mood and affect.  Labs    09/2021: White blood cell count 6100, hemoglobin 14.4, platelets 277,000, lipase 91, potassium 2.5, sodium 137, BUN 11, creatinine 0.99, total bilirubin 1.3, alkaline phosphatase 100, AST 11, ALT 12.  Imaging Studies   XR HIP UNILAT W OR W/O PELVIS 2-3 VIEWS LEFT  Result Date: 02/24/2022 Standing AP pelvis frog-leg left hip demonstrates avascular necrosis involving the femoral head with maintenance of joint space. 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   Assessment   GERD: nocturnal symptoms. Reinforced antireflux measures. EGD up to date.   Nausea/vomiting: suspected cannabinoid hyperemesis syndrome previously. Highly likely playing a role. Patient reports worsening symptoms with cessation of marijuana for 10 weeks. No abdominal pain making biliary etiology unlikely. Best control of symptoms with reglan. Query gastroparesis in setting of suboxone use. Cannot rule out  nausea related to reflux.   Colon cancer screening: colonoscopy in near future.    PLAN   Increase pantoprazole to '40mg'$  daily before breakfast and evening meal.  RX for zofran and reglan sent to pharmacy. Recommending lowest dose to control symptoms for now. Reglan to be temporary/as needed due to risk of side effects.  Recommend cessation of marijuana.  Colonoscopy for screening purposes.  I have discussed the risks, alternatives, benefits with regards to but not limited to the risk of reaction to medication, bleeding, infection, perforation and the patient is agreeable to proceed. Written consent to be obtained.   Laureen Ochs. Bobby Rumpf, [place], [facility]

## 2022-03-07 NOTE — H&P (View-Only) (Signed)
GI Office Note    Referring Provider: [name], MD Primary Care Physician:  [name], MD  Primary Gastroenterologist: Elon Alas. Abbey Chatters, DO   Chief Complaint   Chief Complaint  Patient presents with   Follow-up    Still having nausea/vomiting    History of Present Illness   Unnamed Edward [name] is a 51 y.o. male presenting today for follow-up.  Last seen in June 2023.  32-monthhistory of intermittent episodes of epigastric pain associated with nausea/vomiting.  Episodes separated weeks at a time.  Associated with profuse sweating as well as chills.  EGD fairly unremarkable.  Symptoms felt to be related to cannabinoid hyperemesis syndrome.  Consider further gallbladder work-up in the future if ongoing symptoms.  Recommended colonoscopy after next office visit.  Presents today with complaints of vomiting up thick saliva and acid all the time. No food coming up. Hot/cold chills not as bad. May go a week at a time without symptoms but then be sick for days. States he quit marijuana for 10 weeks and felt like symptoms got worse. Currently smoking marijuana 3-4 times per day. Taking reglan BID. Zofran when flares. Denies abdominal pain. Scheduled for left hip replacement on 03/25/22 and would like to try and complete colonoscopy before. BMs normal. No melena, brbpr. Heartburn 1-2 times per week, nocturnal.  06/2021: 230 pounds 08/2021: 218 pounds 09/2021: 204 pounds Today: 212 pounds     EGD 09/2021 - Z-line regular, 42 cm from the incisors. - Gastritis. Biopsied. - Normal duodenal bulb, first portion of the duodenum and second portion of the duodenum. -gastric mucosa benign with no H.pylori  CT abdomen pelvis with contrast June 2023: Diffuse bladder wall thickening possibly cystitis.  Pancreas unremarkable.  Medications   Current Outpatient Medications  Medication Sig Dispense Refill   acetaminophen (TYLENOL) 500 MG tablet Take 1,000-1,500 mg by mouth daily as needed for  headache or moderate pain.     buprenorphine-naloxone (SUBOXONE) 8-2 mg SUBL SL tablet Place 1 tablet under the tongue in the morning and at bedtime. 60 tablet 0   DULoxetine (CYMBALTA) 30 MG capsule Take 2 capsules (60 mg total) by mouth daily. 180 capsule 3   gabapentin (NEURONTIN) 300 MG capsule Take 1 capsule (300 mg total) by mouth 2 (two) times daily. 90 capsule 5   ibuprofen (ADVIL) 200 MG tablet Take 400-600 mg by mouth daily as needed for moderate pain or headache.     metoCLOPramide (REGLAN) 5 MG tablet Take 1 tablet (5 mg total) by mouth 4 (four) times daily. 120 tablet 1   ondansetron (ZOFRAN) 4 MG tablet Take 4 mg by mouth every 8 (eight) hours as needed.     pantoprazole (PROTONIX) 40 MG tablet Take 1 tablet (40 mg total) by mouth daily. 30 tablet 11   No current facility-administered medications for this visit.    Allergies   Allergies as of 03/07/2022   (No Known Allergies)     Past Medical History   Past Medical History:  Diagnosis Date   Bronchitis    GERD (gastroesophageal reflux disease)     Past Surgical History   Past Surgical History:  Procedure Laterality Date   BIOPSY  09/20/2021   Procedure: BIOPSY;  Surgeon: CEloise Harman DO;  Location: [facility];  Service: Endoscopy;;   ESOPHAGOGASTRODUODENOSCOPY (EGD) WITH PROPOFOL N/A 09/20/2021   Procedure: ESOPHAGOGASTRODUODENOSCOPY (EGD) WITH PROPOFOL;  Surgeon: CEloise Harman DO;  Location: [facility];  Service: Endoscopy;  Laterality: N/A;  12:15pm   NO PAST SURGERIES      Past Family History   Family History  Problem Relation Age of Onset   Diabetes Mother    Heart disease Father    Prostate cancer Father     Past Social History   Social History   Socioeconomic History   Marital status: Married    Spouse name: Not on file   Number of children: Not on file   Years of education: Not on file   Highest education level: Not on file  Occupational History   Not on file  Tobacco Use    Smoking status: Never    Passive exposure: Current   Smokeless tobacco: Never  Vaping Use   Vaping Use: Never used  Substance and Sexual Activity   Alcohol use: Not Currently   Drug use: Yes    Frequency: 7.0 times per week    Types: Marijuana   Sexual activity: Yes  Other Topics Concern   Not on file  Social History Narrative   Not on file   Social Determinants of Health   Financial Resource Strain: Not on file  Food Insecurity: Not on file  Transportation Needs: Not on file  Physical Activity: Not on file  Stress: Not on file  Social Connections: Not on file  Intimate Partner Violence: Not on file    Review of Systems   General: Negative for anorexia,  fever, chills, fatigue, weakness. See hpi ENT: Negative for hoarseness, difficulty swallowing , nasal congestion. CV: Negative for chest pain, angina, palpitations, dyspnea on exertion, peripheral edema.  Respiratory: Negative for dyspnea at rest, dyspnea on exertion, cough, sputum, wheezing.  GI: See history of present illness. GU:  Negative for dysuria, hematuria, urinary incontinence, urinary frequency, nocturnal urination.  Endo: Negative for unusual weight change.     Physical Exam   BP 123/70 (BP Location: Right Arm, Patient Position: Sitting, Cuff Size: Large)   Pulse 70   Temp 98 F (36.7 C) (Oral)   Ht '5\' 9"'$  (1.753 m)   Wt 212 lb 3.2 oz (96.3 kg)   SpO2 98%   BMI 31.34 kg/m    General: Well-nourished, well-developed in no acute distress.  Eyes: No icterus. Mouth: Oropharyngeal mucosa moist and pink , no lesions erythema or exudate. Abdomen: Bowel sounds are normal, nontender, nondistended, no hepatosplenomegaly or masses,  no abdominal bruits or hernia , no rebound or guarding.  Rectal: not performed Extremities: No lower extremity edema. No clubbing or deformities. Neuro: Alert and oriented x 4   Skin: Warm and dry, no jaundice.   Psych: Alert and cooperative, normal mood and affect.  Labs    09/2021: White blood cell count 6100, hemoglobin 14.4, platelets 277,000, lipase 91, potassium 2.5, sodium 137, BUN 11, creatinine 0.99, total bilirubin 1.3, alkaline phosphatase 100, AST 11, ALT 12.  Imaging Studies   XR HIP UNILAT W OR W/O PELVIS 2-3 VIEWS LEFT  Result Date: 02/24/2022 Standing [facility] pelvis frog-leg left hip demonstrates avascular necrosis involving the femoral head with maintenance of joint space. 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   Assessment   GERD: nocturnal symptoms. Reinforced antireflux measures. EGD up to date.   Nausea/vomiting: suspected cannabinoid hyperemesis syndrome previously. Highly likely playing a role. Patient reports worsening symptoms with cessation of marijuana for 10 weeks. No abdominal pain making biliary etiology unlikely. Best control of symptoms with reglan. Query gastroparesis in setting of suboxone use. Cannot rule out  nausea related to reflux.   Colon cancer screening: colonoscopy in near future.    PLAN   Increase pantoprazole to '40mg'$  daily before breakfast and evening meal.  RX for zofran and reglan sent to pharmacy. Recommending lowest dose to control symptoms for now. Reglan to be temporary/as needed due to risk of side effects.  Recommend cessation of marijuana.  Colonoscopy for screening purposes.  I have discussed the risks, alternatives, benefits with regards to but not limited to the risk of reaction to medication, bleeding, infection, perforation and the patient is agreeable to proceed. Written consent to be obtained.   Laureen Ochs. Bobby Rumpf, [place], [facility]

## 2022-03-07 NOTE — Patient Instructions (Signed)
Increase pantoprazole to '40mg'$  daily before breakfast and evening meal. New RX sent to pharmacy. RX for zofran and reglan sent to pharmacy.  Colonoscopy to be scheduled. See separate instructions.

## 2022-03-08 ENCOUNTER — Telehealth: Payer: Self-pay | Admitting: *Deleted

## 2022-03-08 NOTE — Telephone Encounter (Signed)
Called pt to give pre-op appointment on 03/16/22 at 3:15 pm. Verbalized understanding

## 2022-03-16 ENCOUNTER — Encounter (HOSPITAL_COMMUNITY)
Admission: RE | Admit: 2022-03-16 | Discharge: 2022-03-16 | Disposition: A | Discharge status: Home / Self Care | Payer: 59 | Source: Ambulatory Visit | Attending: Internal Medicine | Admitting: Internal Medicine

## 2022-03-16 ENCOUNTER — Other Ambulatory Visit: Payer: Self-pay | Admitting: Radiology

## 2022-03-16 DIAGNOSIS — M87052 Idiopathic aseptic necrosis of left femur: Secondary | ICD-10-CM

## 2022-03-16 NOTE — Pre-Procedure Instructions (Signed)
Surgical Instructions    Your procedure is scheduled on Friday 03/25/22.   Report to [facility] Main Entrance "A" at 05:30 A.M., then check in with the Admitting office.  Call this number if you have problems the morning of surgery:  [phone]   If you have any questions prior to your surgery date call [phone]: Open Monday-Friday 8am-4pm If you experience any cold or flu symptoms such as cough, fever, chills, shortness of breath, etc. between now and your scheduled surgery, please notify us at the above number     Remember:  Do not eat after midnight the night before your surgery  You may drink clear liquids until 04:30 A.M. the morning of your surgery.   Clear liquids allowed are: Water, Non-Citrus Juices (without pulp), Carbonated Beverages, Clear Tea, Black Coffee ONLY (NO MILK, CREAM OR POWDERED CREAMER of any kind), and Gatorade    Take these medicines the morning of surgery with A SIP OF WATER:  DULoxetine (CYMBALTA)   gabapentin (NEURONTIN)   pantoprazole (PROTONIX)    acetaminophen (TYLENOL)- If needed  metoCLOPramide (REGLAN)- If needed ondansetron (ZOFRAN)- If needed promethazine (PHENERGAN)- If needed  Please follow your providers instructions regarding buprenorphine-naloxone (SUBOXONE). If you have not received instructions then please contact your provider for instructions.  As of today, STOP taking any Aspirin (unless otherwise instructed by your surgeon) Aleve, Naproxen, Ibuprofen, Motrin, Advil, Goody's, BC's, all herbal medications, fish oil, and all vitamins.           Do not wear jewelry or makeup. Do not wear lotions, powders, perfumes/cologne or deodorant. Do not shave 48 hours prior to surgery.  Men may shave face and neck. Do not bring valuables to the hospital. Do not wear nail polish, gel polish, artificial nails, or any other type of covering on natural nails (fingers and toes) If you have artificial nails or gel coating that need to be removed  by a nail salon, please have this removed prior to surgery. Artificial nails or gel coating may interfere with anesthesia's ability to adequately monitor your vital signs.   is not responsible for any belongings or valuables.    Do NOT Smoke (Tobacco/Vaping)  24 hours prior to your procedure  If you use a CPAP at night, you may bring your mask for your overnight stay.   Contacts, glasses, hearing aids, dentures or partials may not be worn into surgery, please bring cases for these belongings   For patients admitted to the hospital, discharge time will be determined by your treatment team.   Patients discharged the day of surgery will not be allowed to drive home, and someone needs to stay with them for 24 hours.   SURGICAL WAITING ROOM VISITATION Patients having surgery or a procedure may have no more than 2 support people in the waiting area - these visitors may rotate.   Children under the age of 49 must have an adult with them who is not the patient. If the patient needs to stay at the hospital during part of their recovery, the visitor guidelines for inpatient rooms apply. Pre-op nurse will coordinate an appropriate time for 1 support person to accompany patient in pre-op.  This support person may not rotate.   Please refer to [email or web address] for the visitor guidelines for Inpatients (after your surgery is over and you are in a regular room).    Special instructions:    Oral Hygiene is also important to reduce your risk of infection.  Remember - BRUSH  YOUR TEETH THE MORNING OF SURGERY WITH YOUR REGULAR TOOTHPASTE   [place]- Preparing For Surgery  Before surgery, you can play an important role. Because skin is not sterile, your skin needs to be as free of germs as possible. You can reduce the number of germs on your skin by washing with CHG (chlorahexidine gluconate) Soap before surgery.  CHG is an antiseptic  cleaner which kills germs and bonds with the skin to continue killing germs even after washing.     Please do not use if you have an allergy to CHG or antibacterial soaps. If your skin becomes reddened/irritated stop using the CHG.  Do not shave (including legs and underarms) for at least 48 hours prior to first CHG shower. It is OK to shave your face.  Please follow these instructions carefully.     Shower the NIGHT BEFORE SURGERY and the MORNING OF SURGERY with CHG Soap.   If you chose to wash your hair, wash your hair first as usual with your normal shampoo. After you shampoo, rinse your hair and body thoroughly to remove the shampoo.  Then [employer] and genitals (private parts) with your normal soap and rinse thoroughly to remove soap.  After that Use CHG Soap as you would any other liquid soap. You can apply CHG directly to the skin and wash gently with a scrungie or a clean washcloth.   Apply the CHG Soap to your body ONLY FROM THE NECK DOWN.  Do not use on open wounds or open sores. Avoid contact with your eyes, ears, mouth and genitals (private parts). Wash Face and genitals (private parts)  with your normal soap.   Wash thoroughly, paying special attention to the area where your surgery will be performed.  Thoroughly rinse your body with warm water from the neck down.  DO NOT shower/wash with your normal soap after using and rinsing off the CHG Soap.  Pat yourself dry with a CLEAN TOWEL.  Wear CLEAN PAJAMAS to bed the night before surgery  Place CLEAN SHEETS on your bed the night before your surgery  DO NOT SLEEP WITH PETS.   Day of Surgery:  Take a shower with CHG soap. Wear Clean/Comfortable clothing the morning of surgery Do not apply any deodorants/lotions.   Remember to brush your teeth WITH YOUR REGULAR TOOTHPASTE.    If you received a COVID test during your pre-op visit, it is requested that you wear a mask when out in public, stay away from anyone that may not  be feeling well, and notify your surgeon if you develop symptoms. If you have been in contact with anyone that has tested positive in the last 10 days, please notify your surgeon.    Please read over the following fact sheets that you were given.

## 2022-03-16 NOTE — Pre-Procedure Instructions (Signed)
Attempted pre-op phone call. Left VM for him to call us back. 

## 2022-03-17 ENCOUNTER — Other Ambulatory Visit: Payer: Self-pay

## 2022-03-17 ENCOUNTER — Ambulatory Visit: Payer: 59 | Admitting: Surgery

## 2022-03-17 ENCOUNTER — Other Ambulatory Visit: Payer: Self-pay | Admitting: Internal Medicine

## 2022-03-17 ENCOUNTER — Inpatient Hospital Stay (HOSPITAL_COMMUNITY)
Admission: RE | Admit: 2022-03-17 | Discharge: 2022-03-17 | Disposition: A | Discharge status: Home / Self Care | Payer: 59 | Source: Ambulatory Visit

## 2022-03-17 ENCOUNTER — Telehealth: Payer: Self-pay | Admitting: Radiology

## 2022-03-17 ENCOUNTER — Encounter (HOSPITAL_COMMUNITY): Payer: Self-pay

## 2022-03-17 DIAGNOSIS — M25552 Pain in left hip: Secondary | ICD-10-CM | POA: Diagnosis not present

## 2022-03-17 DIAGNOSIS — F1190 Opioid use, unspecified, uncomplicated: Secondary | ICD-10-CM

## 2022-03-17 DIAGNOSIS — M87052 Idiopathic aseptic necrosis of left femur: Secondary | ICD-10-CM | POA: Diagnosis not present

## 2022-03-17 MED ORDER — BUPRENORPHINE HCL-NALOXONE HCL 8-2 MG SL SUBL: 1.0000 | SUBLINGUAL_TABLET | Freq: Two times a day (BID) | SUBLINGUAL | 0 refills | Status: DC

## 2022-03-17 NOTE — Telephone Encounter (Signed)
Note entered per Dr. Lorin Mercy instructions, oow x 3 months. Patient doing PT and will need hip replacement.  Patient advised ready for pick up in [place] office.

## 2022-03-17 NOTE — Telephone Encounter (Signed)
Patient is requesting out of work note while he attends PT and awaits hip replacement. Surgery was denied prior to needing therapy first.  Please advise. OK for out of work? For how long?  Please call [phone] to advise.

## 2022-03-21 ENCOUNTER — Encounter (HOSPITAL_COMMUNITY): Payer: Self-pay

## 2022-03-21 ENCOUNTER — Encounter: Payer: 59 | Admitting: Student

## 2022-03-21 ENCOUNTER — Ambulatory Visit (HOSPITAL_COMMUNITY)
Admission: RE | Admit: 2022-03-21 | Discharge: 2022-03-21 | Disposition: A | Discharge status: Home / Self Care | Payer: 59 | Attending: Internal Medicine | Admitting: Internal Medicine

## 2022-03-21 ENCOUNTER — Encounter (HOSPITAL_COMMUNITY)
Admission: RE | Disposition: A | Discharge status: Home / Self Care | Payer: Self-pay | Source: Home / Self Care | Attending: Internal Medicine

## 2022-03-21 ENCOUNTER — Ambulatory Visit (HOSPITAL_COMMUNITY): Payer: 59 | Admitting: Anesthesiology

## 2022-03-21 ENCOUNTER — Ambulatory Visit (HOSPITAL_BASED_OUTPATIENT_CLINIC_OR_DEPARTMENT_OTHER): Payer: 59 | Admitting: Anesthesiology

## 2022-03-21 DIAGNOSIS — F1290 Cannabis use, unspecified, uncomplicated: Secondary | ICD-10-CM | POA: Diagnosis not present

## 2022-03-21 DIAGNOSIS — Z1212 Encounter for screening for malignant neoplasm of rectum: Secondary | ICD-10-CM

## 2022-03-21 DIAGNOSIS — Z1211 Encounter for screening for malignant neoplasm of colon: Secondary | ICD-10-CM | POA: Insufficient documentation

## 2022-03-21 DIAGNOSIS — D123 Benign neoplasm of transverse colon: Secondary | ICD-10-CM | POA: Diagnosis not present

## 2022-03-21 DIAGNOSIS — F17200 Nicotine dependence, unspecified, uncomplicated: Secondary | ICD-10-CM | POA: Insufficient documentation

## 2022-03-21 DIAGNOSIS — D122 Benign neoplasm of ascending colon: Secondary | ICD-10-CM | POA: Insufficient documentation

## 2022-03-21 DIAGNOSIS — K648 Other hemorrhoids: Secondary | ICD-10-CM | POA: Insufficient documentation

## 2022-03-21 DIAGNOSIS — K219 Gastro-esophageal reflux disease without esophagitis: Secondary | ICD-10-CM | POA: Diagnosis not present

## 2022-03-21 DIAGNOSIS — F418 Other specified anxiety disorders: Secondary | ICD-10-CM | POA: Insufficient documentation

## 2022-03-21 DIAGNOSIS — R69 Illness, unspecified: Secondary | ICD-10-CM | POA: Diagnosis not present

## 2022-03-21 DIAGNOSIS — D125 Benign neoplasm of sigmoid colon: Secondary | ICD-10-CM | POA: Insufficient documentation

## 2022-03-21 DIAGNOSIS — K635 Polyp of colon: Secondary | ICD-10-CM | POA: Diagnosis not present

## 2022-03-21 HISTORY — PX: POLYPECTOMY: SHX5525

## 2022-03-21 HISTORY — PX: COLONOSCOPY WITH PROPOFOL: SHX5780

## 2022-03-21 SURGERY — COLONOSCOPY WITH PROPOFOL
Anesthesia: General

## 2022-03-21 MED ORDER — PROPOFOL 10 MG/ML IV BOLUS
INTRAVENOUS | Status: DC | PRN
  Administered 2022-03-21 (×2): 100 mg via INTRAVENOUS

## 2022-03-21 MED ORDER — LIDOCAINE HCL (CARDIAC) PF 100 MG/5ML IV SOSY
PREFILLED_SYRINGE | INTRAVENOUS | Status: DC | PRN
  Administered 2022-03-21: 50 mg via INTRATRACHEAL

## 2022-03-21 MED ORDER — LACTATED RINGERS IV SOLN: INTRAVENOUS | Status: DC

## 2022-03-21 MED ORDER — PROPOFOL 500 MG/50ML IV EMUL
INTRAVENOUS | Status: DC | PRN
  Administered 2022-03-21: 200 ug/kg/min via INTRAVENOUS

## 2022-03-21 NOTE — Interval H&P Note (Signed)
History and Physical Interval Note:  03/21/2022 9:48 AM  Edward [name]  has presented today for surgery, with the diagnosis of colon cancer screening.  The various methods of treatment have been discussed with the patient and family. After consideration of risks, benefits and other options for treatment, the patient has consented to  Procedure(s) with comments: COLONOSCOPY WITH PROPOFOL (N/A) - 2:00 pm as a surgical intervention.  The patient's history has been reviewed, patient examined, no change in status, stable for surgery.  I have reviewed the patient's chart and labs.  Questions were answered to the patient's satisfaction.     [name]

## 2022-03-21 NOTE — Discharge Instructions (Signed)
  Colonoscopy Discharge Instructions  Read the instructions outlined below and refer to this sheet in the next few weeks. These discharge instructions provide you with general information on caring for yourself after you leave the hospital. Your doctor may also give you specific instructions. While your treatment has been planned according to the most current medical practices available, unavoidable complications occasionally occur.   ACTIVITY You may resume your regular activity, but move at a slower pace for the next 24 hours.  Take frequent rest periods for the next 24 hours.  Walking will help get rid of the air and reduce the bloated feeling in your belly (abdomen).  No driving for 24 hours (because of the medicine (anesthesia) used during the test).   Do not sign any important legal documents or operate any machinery for 24 hours (because of the anesthesia used during the test).  NUTRITION Drink plenty of fluids.  You may resume your normal diet as instructed by your doctor.  Begin with a light meal and progress to your normal diet. Heavy or fried foods are harder to digest and may make you feel sick to your stomach (nauseated).  Avoid alcoholic beverages for 24 hours or as instructed.  MEDICATIONS You may resume your normal medications unless your doctor tells you otherwise.  WHAT YOU CAN EXPECT TODAY Some feelings of bloating in the abdomen.  Passage of more gas than usual.  Spotting of blood in your stool or on the toilet paper.  IF YOU HAD POLYPS REMOVED DURING THE COLONOSCOPY: No aspirin products for 7 days or as instructed.  No alcohol for 7 days or as instructed.  Eat a soft diet for the next 24 hours.  FINDING OUT THE RESULTS OF YOUR TEST Not all test results are available during your visit. If your test results are not back during the visit, make an appointment with your caregiver to find out the results. Do not assume everything is normal if you have not heard from your  caregiver or the medical facility. It is important for you to follow up on all of your test results.  SEEK IMMEDIATE MEDICAL ATTENTION IF: You have more than a spotting of blood in your stool.  Your belly is swollen (abdominal distention).  You are nauseated or vomiting.  You have a temperature over 101.  You have abdominal pain or discomfort that is severe or gets worse throughout the day.   Your colonoscopy revealed 6 polyp(s) which I removed successfully. Await pathology results, my office will contact you. I recommend repeating colonoscopy in 3 years for surveillance purposes.   Follow up with GI in 3 months.   I hope you have a great rest of your week!  Elon Alas. Abbey Chatters, D.O. Gastroenterology and Hepatology [facility]

## 2022-03-21 NOTE — Op Note (Signed)
[facility] Patient Name: Edward [name] Procedure Date: 03/21/2022 9:27 AM MRN: [id number] Date of Birth: 1970/10/03 Attending MD: Elon Alas. Abbey Chatters , [place], [id number] CSN: [id number] Age: 51 Admit Type: Outpatient Procedure:                Colonoscopy Indications:              Screening for colorectal malignant neoplasm Providers:                Elon Alas. Abbey Chatters, DO, Hughie Closs RN, RN,                            Suzan Garibaldi. Risa Grill, Technician Referring MD:              Medicines:                See the Anesthesia note for documentation of the                            administered medications Complications:            No immediate complications. Estimated Blood Loss:     Estimated blood loss was minimal. Procedure:                Pre-Anesthesia Assessment:                           - The anesthesia plan was to use monitored                            anesthesia care (MAC).                           After obtaining informed consent, the colonoscope                            was passed under direct vision. Throughout the                            procedure, the patient's blood pressure, pulse, and                            oxygen saturations were monitored continuously. The                            [id number]-HQ190L ([id number]) scope was introduced through                            the anus and advanced to the the cecum, identified                            by appendiceal orifice and ileocecal valve. The                            colonoscopy was performed without difficulty. The                            patient tolerated  the procedure well. The quality                            of the bowel preparation was evaluated using the                            BBPS [facility] Bowel Preparation Scale) with scores                            of: Right Colon = 3, Transverse Colon = 3 and Left                            Colon = 3 (entire mucosa seen well with no residual                             staining, small fragments of stool or opaque                            liquid). The total BBPS score equals 9. Scope In: 9:50:26 AM Scope Out: 10:10:03 AM Scope Withdrawal Time: 0 hours 16 minutes 36 seconds  Total Procedure Duration: 0 hours 19 minutes 37 seconds  Findings:      The perianal and digital rectal examinations were normal.      Non-bleeding internal hemorrhoids were found during endoscopy.      A 13 mm polyp was found in the ascending colon. The polyp was sessile.       The polyp was removed with a cold snare. Resection and retrieval were       complete.      Five sessile polyps were found in the sigmoid colon and transverse       colon. The polyps were 4 to 6 mm in size. These polyps were removed with       a cold snare. Resection and retrieval were complete.      The exam was otherwise without abnormality. Impression:               - Non-bleeding internal hemorrhoids.                           - One 13 mm polyp in the ascending colon, removed                            with a cold snare. Resected and retrieved.                           - Five 4 to 6 mm polyps in the sigmoid colon and in                            the transverse colon, removed with a cold snare.                            Resected and retrieved.                           - The examination  was otherwise normal. Moderate Sedation:      Per Anesthesia Care Recommendation:           - Patient has a contact number available for                            emergencies. The signs and symptoms of potential                            delayed complications were discussed with the                            patient. Return to normal activities tomorrow.                            Written discharge instructions were provided to the                            patient.                           - Resume previous diet.                           - Continue present medications.                           -  Await pathology results.                           - Repeat colonoscopy in 3 years for surveillance.                           - Return to GI clinic in 3 months. Procedure Code(s):        --- Professional ---                           [phone], Colonoscopy, flexible; with removal of                            tumor(s), polyp(s), or other lesion(s) by snare                            technique Diagnosis Code(s):        --- Professional ---                           Z12.11, Encounter for screening for malignant                            neoplasm of colon                           D12.2, Benign neoplasm of ascending colon                           D12.5, Benign neoplasm of sigmoid colon  D12.3, Benign neoplasm of transverse colon (hepatic                            flexure or splenic flexure)                           K64.8, Other hemorrhoids CPT copyright 2022 American Medical Association. All rights reserved. The codes documented in this report are preliminary and upon coder review may  be revised to meet current compliance requirements. Elon Alas. Abbey Chatters, DO [place] Abbey Chatters, DO 03/21/2022 10:12:46 AM This report has been signed electronically. Number of Addenda: 0

## 2022-03-21 NOTE — Anesthesia Postprocedure Evaluation (Signed)
Anesthesia Post Note  Patient: Chancellor Vanderloop Gomez  Procedure(s) Performed: COLONOSCOPY WITH PROPOFOL POLYPECTOMY  Patient location during evaluation: Phase II Anesthesia Type: General Level of consciousness: awake and alert and oriented Pain management: pain level controlled Vital Signs Assessment: post-procedure vital signs reviewed and stable Respiratory status: spontaneous breathing, nonlabored ventilation and respiratory function stable Cardiovascular status: blood pressure returned to baseline and stable Postop Assessment: no apparent nausea or vomiting Anesthetic complications: no  No notable events documented.   Last Vitals:  Vitals:   03/21/22 0840 03/21/22 1013  BP: (!) 164/99 (!) 149/83  Pulse: 65 66  Resp: 16 18  Temp: 37 C (!) 36.3 C  SpO2: 95% 97%    Last Pain:  Vitals:   03/21/22 1013  TempSrc: Oral  PainSc: 0-No pain                 Avrum Kimball C Christinia Lambeth

## 2022-03-21 NOTE — Anesthesia Preprocedure Evaluation (Signed)
Anesthesia Evaluation  Patient identified by MRN, date of birth, ID band Patient awake    Reviewed: Allergy & Precautions, H&P , NPO status , Patient's Chart, lab work & pertinent test results  Airway Mallampati: II  TM Distance: >3 FB Neck ROM: Full    Dental  (+) Dental Advisory Given, Missing, Chipped   Pulmonary neg pulmonary ROS, Patient abstained from smoking.   Pulmonary exam normal breath sounds clear to auscultation       Cardiovascular hypertension, Pt. on medications  Rhythm:Regular Rate:Normal + Systolic murmurs Vent. rate 70 BPM PR interval 147 ms QRS duration 93 ms QT/QTcB 417/450 ms P-R-T axes 49 61 29   Neuro/Psych  PSYCHIATRIC DISORDERS Anxiety Depression     Neuromuscular disease (radicular pain of left lower extremity)    GI/Hepatic ,GERD  Medicated and Controlled,,(+)     substance abuse (Cannabinoid hyperemesis syndrome)  marijuana use  Endo/Other  negative endocrine ROS    Renal/GU negative Renal ROS  negative genitourinary   Musculoskeletal negative musculoskeletal ROS (+)  narcotic dependent  Abdominal   Peds negative pediatric ROS (+)  Hematology negative hematology ROS (+)   Anesthesia Other Findings   Reproductive/Obstetrics negative OB ROS                             Anesthesia Physical Anesthesia Plan  ASA: 2  Anesthesia Plan: General   Post-op Pain Management: Minimal or no pain anticipated   Induction:   PONV Risk Score and Plan: Propofol infusion  Airway Management Planned: Nasal Cannula and Natural Airway  Additional Equipment:   Intra-op Plan:   Post-operative Plan:   Informed Consent: I have reviewed the patients History and Physical, chart, labs and discussed the procedure including the risks, benefits and alternatives for the proposed anesthesia with the patient or authorized representative who has indicated his/her understanding and  acceptance.     Dental advisory given  Plan Discussed with: CRNA and Surgeon  Anesthesia Plan Comments:         Anesthesia Quick Evaluation

## 2022-03-21 NOTE — Transfer of Care (Signed)
Immediate Anesthesia Transfer of Care Note  Patient: Edward [name]  Procedure(s) Performed: COLONOSCOPY WITH PROPOFOL POLYPECTOMY  Patient Location: Short Stay  Anesthesia Type:General  Level of Consciousness: awake, alert , oriented, and patient cooperative  Airway & Oxygen Therapy: Patient Spontanous Breathing  Post-op Assessment: Report given to RN, Post -op Vital signs reviewed and stable, and Patient moving all extremities  Post vital signs: Reviewed and stable  Last Vitals:  Vitals Value Taken Time  BP    Temp    Pulse    Resp    SpO2      Last Pain:  Vitals:   03/21/22 0945  TempSrc:   PainSc: 4       Patients Stated Pain Goal: 3 (44/62/86 3817)  Complications: No notable events documented.

## 2022-03-22 ENCOUNTER — Ambulatory Visit: Payer: 59 | Admitting: Gastroenterology

## 2022-03-22 DIAGNOSIS — M87052 Idiopathic aseptic necrosis of left femur: Secondary | ICD-10-CM | POA: Diagnosis not present

## 2022-03-22 DIAGNOSIS — M25552 Pain in left hip: Secondary | ICD-10-CM | POA: Diagnosis not present

## 2022-03-22 LAB — SURGICAL PATHOLOGY

## 2022-03-24 ENCOUNTER — Other Ambulatory Visit: Payer: Self-pay | Admitting: Internal Medicine

## 2022-03-24 DIAGNOSIS — F419 Anxiety disorder, unspecified: Secondary | ICD-10-CM

## 2022-03-25 ENCOUNTER — Encounter: Payer: Self-pay | Admitting: Student

## 2022-03-25 ENCOUNTER — Ambulatory Visit (INDEPENDENT_AMBULATORY_CARE_PROVIDER_SITE_OTHER): Payer: 59 | Admitting: Student

## 2022-03-25 ENCOUNTER — Ambulatory Visit: Admit: 2022-03-25 | Payer: 59 | Admitting: Orthopaedic Surgery

## 2022-03-25 ENCOUNTER — Other Ambulatory Visit: Payer: Self-pay

## 2022-03-25 VITALS — BP 127/84 | HR 75 | Temp 98.1°F | Resp 32 | Ht 69.0 in | Wt 207.6 lb

## 2022-03-25 DIAGNOSIS — F1190 Opioid use, unspecified, uncomplicated: Secondary | ICD-10-CM | POA: Diagnosis not present

## 2022-03-25 DIAGNOSIS — Z86010 Personal history of colonic polyps: Secondary | ICD-10-CM | POA: Diagnosis not present

## 2022-03-25 DIAGNOSIS — M87052 Idiopathic aseptic necrosis of left femur: Secondary | ICD-10-CM

## 2022-03-25 DIAGNOSIS — F1290 Cannabis use, unspecified, uncomplicated: Secondary | ICD-10-CM | POA: Diagnosis not present

## 2022-03-25 DIAGNOSIS — R112 Nausea with vomiting, unspecified: Secondary | ICD-10-CM | POA: Diagnosis not present

## 2022-03-25 DIAGNOSIS — R69 Illness, unspecified: Secondary | ICD-10-CM | POA: Diagnosis not present

## 2022-03-25 SURGERY — ARTHROPLASTY, HIP, TOTAL, ANTERIOR APPROACH
Anesthesia: Spinal | Site: Hip | Laterality: Left

## 2022-03-25 NOTE — Patient Instructions (Signed)
It was pleasure seeing you in the clinic today.  Please continue with the suboxone you should have a refill sent in   Follow up in 3 months

## 2022-03-26 DIAGNOSIS — Z86010 Personal history of colonic polyps: Secondary | ICD-10-CM | POA: Insufficient documentation

## 2022-03-26 NOTE — Assessment & Plan Note (Signed)
Doing well on Suboxone 8-2 mg 2 tablets daily. 1 in the morning and 1/2 in afternoon and evening. No cravings, relapses or withdrawals with this. Last UDS in November is appropriate. Will continue on current subxone dosing and extend follow up to 3 months.

## 2022-03-26 NOTE — Progress Notes (Signed)
Established Patient Office Visit  Subjective   Patient ID: Edward [name], male    DOB: July 30, 1970  Age: 51 y.o. MRN: [id number]  Chief Complaint  Patient presents with   Follow-up   Medication Refill    Suboxone     Edward [name] is a 51 y.o. person living with a history listed below who presents to clinic for OUD follow up. Please refer to problem based charting for further details and assessment and plan of current problem and chronic medical conditions.     Patient Active Problem List   Diagnosis Date Noted   Hx of colonic polyps 03/26/2022   N&V (nausea and vomiting) 03/07/2022   Colon cancer screening 03/07/2022   Lipoma 02/18/2022   Hypertension 02/18/2022   Radicular pain of left lower extremity 01/20/2022   Healthcare maintenance 01/20/2022   Cannabinoid hyperemesis syndrome 10/06/2021   Abdominal pain, chronic, epigastric 10/06/2021   GERD (gastroesophageal reflux disease) 10/06/2021   Avascular necrosis of femoral head ([place]) 06/15/2021   Opioid use disorder 05/19/2021   Chronic lower back pain 05/19/2021   Anxiety and depression 05/19/2021       ROS: negative as per HPI    Objective:     BP 127/84 (BP Location: Right Arm, Patient Position: Sitting, Cuff Size: Normal)   Pulse 75   Temp 98.1 F (36.7 C) (Oral)   Resp (!) 32   Ht '5\' 9"'$  (1.753 m)   Wt 207 lb 9.6 oz (94.2 kg)   SpO2 97% Comment: room air  BMI 30.66 kg/m  BP Readings from Last 3 Encounters:  03/25/22 127/84  03/21/22 (!) 149/83  03/07/22 123/70     Physical Exam Constitutional:      Appearance: Normal appearance.  HENT:     Mouth/Throat:     Mouth: Mucous membranes are moist.     Pharynx: Oropharynx is clear.  Cardiovascular:     Rate and Rhythm: Normal rate and regular rhythm.  Pulmonary:     Effort: Pulmonary effort is normal.     Breath sounds: No rhonchi or rales.  Abdominal:     General: Abdomen is flat. Bowel sounds are normal. There is no distension.      Palpations: Abdomen is soft.     Tenderness: There is no abdominal tenderness.  Musculoskeletal:        General: Normal range of motion.     Right lower leg: No edema.     Left lower leg: No edema.  Skin:    General: Skin is warm and dry.     Capillary Refill: Capillary refill takes less than 2 seconds.  Neurological:     General: No focal deficit present.     Mental Status: He is alert and oriented to person, place, and time.  Psychiatric:        Mood and Affect: Mood normal.        Behavior: Behavior normal.      No results found for any visits on 03/25/22.     The ASCVD Risk score (Arnett DK, et al., 2019) failed to calculate for the following reasons:   Cannot find a previous HDL lab   Cannot find a previous total cholesterol lab    Assessment & Plan:   Problem List Items Addressed This Visit       Digestive   Cannabinoid hyperemesis syndrome    Nausea improved after recent colonoscopy. Still smoking THC regularly. Will continue to monitor symptoms.  Musculoskeletal and Integument   Avascular necrosis of femoral head (HCC) - Primary (Chronic)    Currently in PT for hip pain. Plan to have hip  replacement after completing PT. States hip pain is manageable on current medications.        Other   Opioid use disorder (Chronic)    Doing well on Suboxone 8-2 mg 2 tablets daily. 1 in the morning and 1/2 in afternoon and evening. No cravings, relapses or withdrawals with this. Last UDS in November is appropriate. Will continue on current subxone dosing and extend follow up to 3 months.       Hx of colonic polyps    Colonoscopy on 03/21/2022, with 13 mm ascending colon polyp  and five 4-71m polyps of the sigmoid and transverse colon resected. Tubular adenoma on surgical pathology. Needs repeat colonoscopy in 3 years. Discussed results with patient.        Return in about 3 months (around 06/24/2022).    JIona Beard MD

## 2022-03-26 NOTE — Assessment & Plan Note (Signed)
Currently in PT for hip pain. Plan to have hip  replacement after completing PT. States hip pain is manageable on current medications.

## 2022-03-26 NOTE — Assessment & Plan Note (Addendum)
Colonoscopy on 03/21/2022, with 13 mm ascending colon polyp  and five 4-62m polyps of the sigmoid and transverse colon resected. Tubular adenoma on surgical pathology. Needs repeat colonoscopy in 3 years. Discussed results with patient.

## 2022-03-26 NOTE — Assessment & Plan Note (Signed)
Nausea improved after recent colonoscopy. Still smoking THC regularly. Will continue to monitor symptoms.

## 2022-03-28 ENCOUNTER — Encounter (HOSPITAL_COMMUNITY): Payer: Self-pay | Admitting: Internal Medicine

## 2022-03-28 NOTE — Progress Notes (Signed)
Internal Medicine Clinic Attending ? ?Case discussed with Dr. Liang  At the time of the visit.  We reviewed the resident?s history and exam and pertinent patient test results.  I agree with the assessment, diagnosis, and plan of care documented in the resident?s note. ? ?

## 2022-03-29 DIAGNOSIS — M25552 Pain in left hip: Secondary | ICD-10-CM | POA: Diagnosis not present

## 2022-03-29 DIAGNOSIS — M87052 Idiopathic aseptic necrosis of left femur: Secondary | ICD-10-CM | POA: Diagnosis not present

## 2022-03-31 ENCOUNTER — Encounter: Payer: 59 | Admitting: Orthopaedic Surgery

## 2022-04-04 ENCOUNTER — Telehealth: Payer: Self-pay

## 2022-04-04 NOTE — Telephone Encounter (Signed)
Please ask patient to take reglan '5mg'$  four times daily for now, before meals and at bedtime until symptoms settle down. Once symptoms settle down, he should go back to using only as needed.   -He should transition to a clear liquid diet when he has vomiting.  -Eat small meals throughout the day, avoid overeating.  -Avoid greasy, fried foods.  -Please send him gastroparesis diet.   -reduce marijuana use -if persistent vomiting, he should go to the ER

## 2022-04-04 NOTE — Telephone Encounter (Signed)
Pt is having issues with a sour stomach. States that he has been belching and been vomiting and it smells like rotten eggs. Please advise.

## 2022-04-04 NOTE — Telephone Encounter (Signed)
Pt was made aware and verbalized understanding. Mailed pt gastroparesis diet.

## 2022-04-08 ENCOUNTER — Other Ambulatory Visit: Payer: Self-pay | Admitting: Gastroenterology

## 2022-04-14 DIAGNOSIS — M25552 Pain in left hip: Secondary | ICD-10-CM | POA: Diagnosis not present

## 2022-04-14 DIAGNOSIS — M87052 Idiopathic aseptic necrosis of left femur: Secondary | ICD-10-CM | POA: Diagnosis not present

## 2022-04-19 DIAGNOSIS — M25552 Pain in left hip: Secondary | ICD-10-CM | POA: Diagnosis not present

## 2022-04-19 DIAGNOSIS — M87052 Idiopathic aseptic necrosis of left femur: Secondary | ICD-10-CM | POA: Diagnosis not present

## 2022-04-21 DIAGNOSIS — M87052 Idiopathic aseptic necrosis of left femur: Secondary | ICD-10-CM | POA: Diagnosis not present

## 2022-04-21 DIAGNOSIS — M25552 Pain in left hip: Secondary | ICD-10-CM | POA: Diagnosis not present

## 2022-04-26 DIAGNOSIS — M25552 Pain in left hip: Secondary | ICD-10-CM | POA: Diagnosis not present

## 2022-04-26 DIAGNOSIS — M87052 Idiopathic aseptic necrosis of left femur: Secondary | ICD-10-CM | POA: Diagnosis not present

## 2022-04-28 DIAGNOSIS — M87052 Idiopathic aseptic necrosis of left femur: Secondary | ICD-10-CM | POA: Diagnosis not present

## 2022-04-28 DIAGNOSIS — M25552 Pain in left hip: Secondary | ICD-10-CM | POA: Diagnosis not present

## 2022-04-29 ENCOUNTER — Other Ambulatory Visit: Payer: Self-pay

## 2022-04-29 DIAGNOSIS — F1190 Opioid use, unspecified, uncomplicated: Secondary | ICD-10-CM

## 2022-04-29 NOTE — Telephone Encounter (Signed)
Requesting refill on [facility]oxone to be filled [facility].

## 2022-05-02 MED ORDER — BUPRENORPHINE HCL-NALOXONE HCL 8-2 MG SL SUBL: 1.0000 | SUBLINGUAL_TABLET | Freq: Two times a day (BID) | SUBLINGUAL | 0 refills | Status: AC

## 2022-05-05 DIAGNOSIS — M87052 Idiopathic aseptic necrosis of left femur: Secondary | ICD-10-CM | POA: Diagnosis not present

## 2022-05-05 DIAGNOSIS — M25552 Pain in left hip: Secondary | ICD-10-CM | POA: Diagnosis not present

## 2022-05-10 DIAGNOSIS — M87052 Idiopathic aseptic necrosis of left femur: Secondary | ICD-10-CM | POA: Diagnosis not present

## 2022-05-10 DIAGNOSIS — M25552 Pain in left hip: Secondary | ICD-10-CM | POA: Diagnosis not present

## 2022-05-17 DIAGNOSIS — M25552 Pain in left hip: Secondary | ICD-10-CM | POA: Diagnosis not present

## 2022-05-17 DIAGNOSIS — M87052 Idiopathic aseptic necrosis of left femur: Secondary | ICD-10-CM | POA: Diagnosis not present

## 2022-05-19 ENCOUNTER — Ambulatory Visit (INDEPENDENT_AMBULATORY_CARE_PROVIDER_SITE_OTHER): Payer: 59

## 2022-05-19 ENCOUNTER — Ambulatory Visit (INDEPENDENT_AMBULATORY_CARE_PROVIDER_SITE_OTHER): Payer: 59 | Admitting: Orthopaedic Surgery

## 2022-05-19 ENCOUNTER — Encounter: Payer: Self-pay | Admitting: Orthopaedic Surgery

## 2022-05-19 ENCOUNTER — Ambulatory Visit: Payer: Self-pay

## 2022-05-19 VITALS — Ht 69.0 in | Wt 205.0 lb

## 2022-05-19 DIAGNOSIS — M25552 Pain in left hip: Secondary | ICD-10-CM | POA: Diagnosis not present

## 2022-05-19 DIAGNOSIS — M87052 Idiopathic aseptic necrosis of left femur: Secondary | ICD-10-CM

## 2022-05-19 DIAGNOSIS — M25551 Pain in right hip: Secondary | ICD-10-CM | POA: Diagnosis not present

## 2022-05-19 NOTE — Progress Notes (Signed)
Office Visit Note   Patient: Edward [name]           Date of Birth: 10/07/1970           MRN: [id number] Visit Date: 05/19/2022              Requested by: [name], MD [address] [place],  [place] [address] PCP: [name], MD   Assessment & Plan: Visit Diagnoses:  1. Pain in right hip   2. Pain in left hip   3. Avascular necrosis of left femoral head (HCC)     Plan: Patient with symptomatic left hip AVN.  He still does not collapse with hip in the last year and is still doing Architect work.  I will check him back again in 2 months.  He thinks he is walking slightly better after therapy.  We discussed the chance that it had may revascularize without collapse.  If he has increased symptoms and we can proceed with  left total hip arthroplasty.  Follow-Up Instructions: Return in about 2 months (around 07/18/2022).   Orders:  Orders Placed This Encounter  Procedures   XR HIPS BILAT W OR W/O PELVIS 3-4 VIEWS   No orders of the defined types were placed in this encounter.     Procedures: No procedures performed   Clinical Data: No additional findings.   Subjective: Chief Complaint  Patient presents with   Left Hip - Pain, Follow-up    HPI 52 year old male returns he is been through physical therapy for his hip has avascular necrosis of both hips but has the pain on the left side not on the right.  He continues to have a slight limp some pain.  Wife is with him today and she states he has pain every day and thinks he needs to consider proceeding with surgery.    Review of Systems 14 point review of systems updated unchanged.  Of note his past history of opioid use disorder.  Past history of anxiety depression.  Patient has never been alcohol user.   Objective: Vital Signs: Ht '5\' 9"'$  (1.753 m)   Wt 205 lb (93 kg)   BMI 30.27 kg/m   Physical Exam Constitutional:      Appearance: He is well-developed.  HENT:     Head: Normocephalic and atraumatic.      Right Ear: External ear normal.     Left Ear: External ear normal.  Eyes:     Pupils: Pupils are equal, round, and reactive to light.  Neck:     Thyroid: No thyromegaly.     Trachea: No tracheal deviation.  Cardiovascular:     Rate and Rhythm: Normal rate.  Pulmonary:     Effort: Pulmonary effort is normal.     Breath sounds: No wheezing.  Abdominal:     General: Bowel sounds are normal.     Palpations: Abdomen is soft.  Musculoskeletal:     Cervical back: Neck supple.  Skin:    General: Skin is warm and dry.     Capillary Refill: Capillary refill takes less than 2 seconds.  Neurological:     Mental Status: He is alert and oriented to person, place, and time.  Psychiatric:        Behavior: Behavior normal.        Thought Content: Thought content normal.        Judgment: Judgment normal.     Ortho Exam patient has discomfort with internal rotation of his  left hip with his right.  Femoral lengths are identical.  Patient ambulates with a mild left hip limp.  Distal pulses are intact.  Specialty Comments:  No specialty comments available.  Imaging: No results found.   PMFS History: Patient Active Problem List   Diagnosis Date Noted   Hx of colonic polyps 03/26/2022   N&V (nausea and vomiting) 03/07/2022   Colon cancer screening 03/07/2022   Lipoma 02/18/2022   Hypertension 02/18/2022   Radicular pain of left lower extremity 01/20/2022   Healthcare maintenance 01/20/2022   Cannabinoid hyperemesis syndrome 10/06/2021   Abdominal pain, chronic, epigastric 10/06/2021   GERD (gastroesophageal reflux disease) 10/06/2021   Avascular necrosis of femoral head ([place]) 06/15/2021   Opioid use disorder 05/19/2021   Chronic lower back pain 05/19/2021   Anxiety and depression 05/19/2021   Past Medical History:  Diagnosis Date   Bronchitis    GERD (gastroesophageal reflux disease)     Family History  Problem Relation Age of Onset   Diabetes Mother    Heart disease Father     Prostate cancer Father     Past Surgical History:  Procedure Laterality Date   BIOPSY  09/20/2021   Procedure: BIOPSY;  Surgeon: [name], DO;  [facility];  Service: Endoscopy;;   COLONOSCOPY WITH PROPOFOL N/A 03/21/2022   Procedure: COLONOSCOPY WITH PROPOFOL;  Surgeon: [name], DO;  [facility];  Service: Endoscopy;  Laterality: N/A;  2:00 pm   ESOPHAGOGASTRODUODENOSCOPY (EGD) WITH PROPOFOL N/A 09/20/2021   Procedure: ESOPHAGOGASTRODUODENOSCOPY (EGD) WITH PROPOFOL;  Surgeon: [name], DO;  [facility]: [facility];  Service: Endoscopy;  Laterality: N/A;  12:15pm   NO PAST SURGERIES     POLYPECTOMY  03/21/2022   Procedure: POLYPECTOMY;  Surgeon: [name], DO;  [facility];  Service: Endoscopy;;   Social History   Occupational History   Not on file  Tobacco Use   Smoking status: Never    Passive exposure: Current   Smokeless tobacco: Never  Vaping Use   Vaping Use: Never used  Substance and Sexual Activity   Alcohol use: Not Currently   Drug use: Yes    Frequency: 7.0 times per week    Types: Marijuana   Sexual activity: Yes

## 2022-05-23 ENCOUNTER — Telehealth: Payer: Self-pay

## 2022-05-23 NOTE — Telephone Encounter (Signed)
Pt is requesting refills on his pantoprazole. Pt was last seen on 03/07/22.

## 2022-05-23 NOTE — Telephone Encounter (Signed)
Thanks

## 2022-05-23 NOTE — Telephone Encounter (Signed)
He was given one year of refills in 02/2022.

## 2022-05-23 NOTE — Telephone Encounter (Signed)
Contacted pharmacy and it was needing a PA for quantity limits. PA was approved. Approval letter to be scanned into patient's chart.

## 2022-05-24 ENCOUNTER — Other Ambulatory Visit: Payer: Self-pay | Admitting: Internal Medicine

## 2022-05-24 DIAGNOSIS — F419 Anxiety disorder, unspecified: Secondary | ICD-10-CM

## 2022-05-31 DIAGNOSIS — M25552 Pain in left hip: Secondary | ICD-10-CM | POA: Diagnosis not present

## 2022-05-31 DIAGNOSIS — M87052 Idiopathic aseptic necrosis of left femur: Secondary | ICD-10-CM | POA: Diagnosis not present

## 2022-06-01 ENCOUNTER — Other Ambulatory Visit: Payer: Self-pay

## 2022-06-01 DIAGNOSIS — F1190 Opioid use, unspecified, uncomplicated: Secondary | ICD-10-CM

## 2022-06-01 NOTE — Telephone Encounter (Signed)
Last rx written was 05/02/22. Next appt 06/24/22 with Dr Humphrey Rolls.

## 2022-06-03 ENCOUNTER — Other Ambulatory Visit (INDEPENDENT_AMBULATORY_CARE_PROVIDER_SITE_OTHER): Payer: 59 | Admitting: Internal Medicine

## 2022-06-03 ENCOUNTER — Other Ambulatory Visit (HOSPITAL_COMMUNITY): Payer: Self-pay

## 2022-06-03 DIAGNOSIS — F1190 Opioid use, unspecified, uncomplicated: Secondary | ICD-10-CM

## 2022-06-03 MED ORDER — BUPRENORPHINE HCL-NALOXONE HCL 8-2 MG SL SUBL
1.0000 | SUBLINGUAL_TABLET | Freq: Every day | SUBLINGUAL | 0 refills | Status: DC
  Filled 2022-06-03: qty 60, 60d supply, fill #0

## 2022-06-03 NOTE — Progress Notes (Signed)
Patient with OUD with stable dosing of suboxone. PDMP reviewed and appropriate. Last toxassure 11/23 appropriate with [facility] which patient endorsed. -Suboxone 8-2 tablets sent to [facility]  Addendum 2/19: RX instructions updated to reflect 1 tab in AM, 1/2 tab in afternoon, and 1/2 tab in evening.

## 2022-06-06 ENCOUNTER — Other Ambulatory Visit: Payer: Self-pay

## 2022-06-06 ENCOUNTER — Telehealth: Payer: Self-pay

## 2022-06-06 ENCOUNTER — Ambulatory Visit (INDEPENDENT_AMBULATORY_CARE_PROVIDER_SITE_OTHER): Payer: 59

## 2022-06-06 ENCOUNTER — Other Ambulatory Visit (HOSPITAL_COMMUNITY): Payer: Self-pay

## 2022-06-06 ENCOUNTER — Other Ambulatory Visit: Payer: Self-pay | Admitting: Student

## 2022-06-06 DIAGNOSIS — R1115 Cyclical vomiting syndrome unrelated to migraine: Secondary | ICD-10-CM | POA: Diagnosis not present

## 2022-06-06 DIAGNOSIS — R739 Hyperglycemia, unspecified: Secondary | ICD-10-CM

## 2022-06-06 DIAGNOSIS — F1190 Opioid use, unspecified, uncomplicated: Secondary | ICD-10-CM

## 2022-06-06 DIAGNOSIS — F1290 Cannabis use, unspecified, uncomplicated: Secondary | ICD-10-CM

## 2022-06-06 DIAGNOSIS — R1114 Bilious vomiting: Secondary | ICD-10-CM | POA: Diagnosis not present

## 2022-06-06 DIAGNOSIS — R112 Nausea with vomiting, unspecified: Secondary | ICD-10-CM

## 2022-06-06 MED ORDER — BUPRENORPHINE HCL-NALOXONE HCL 8-2 MG SL SUBL: 1.0000 | SUBLINGUAL_TABLET | Freq: Every day | SUBLINGUAL | 0 refills | Status: DC

## 2022-06-06 MED ORDER — BUPRENORPHINE HCL-NALOXONE HCL 8-2 MG SL SUBL
SUBLINGUAL_TABLET | SUBLINGUAL | 0 refills | Status: DC
  Filled 2022-06-06: qty 60, 30d supply, fill #0

## 2022-06-06 NOTE — Telephone Encounter (Signed)
Pt's states the direction on suboxone is wrong, requesting the doctor to fixed the direction to twice daily instead once daily. Please call pt back.

## 2022-06-06 NOTE — Assessment & Plan Note (Signed)
Patient endorses around 2 years total of episodic N/V. Occurs around 2 times per month, sometimes once per week, 2-3 days at a time. Nausea typically more prominent than vomiting but does have frothy green vomit when he does vomit. No blood or whole food in his vomit. All unrelated to food intake, can't eat for days when this happens. Weight loss from 217lbs 01/2022 to 205 lbs 05/2022.Gets hot and diaphoretic during episodes. Not always having abdominal pain but does get some cramping with this. Occasional diarrhea but not always and no constipation. Denies headaches when this happen. Hot showers sometimes help but not always. No significant NSAID use, just occasional ibuprofen. Has tried zofran, promethazine, metoclopramide and doesn't help. Smokes marijuana 2-3 times daily and has stopped for up to 10 weeks with persistence of vomiting. Has had multiple visits with GI, no evidence of cause from colonoscopy or EGD in 2023. Not infectious given the time course. Not likely CNS given no abnormal neuro findings in the past and no vertiginous symptoms. No headaches so not likely abdominal migraine. None of his meds seem to be attributable to this. No elevated inflammatory markers in past to suggest IBD. Diarrhea and constipation not primary components so less likely IBS. Does have anxiety/ depression but this would be diagnosis of exclusion. Never tested for celiac but prominent N/V with no bloating, no effect on bowel movement and not food related makes this less likely. No electrolyte or BP issues to suggest adrenal insufficiency.Has had elevated glucose on lab testing before, could reflect diabetes although it has not been significantly elevated. Could be thyroid. Will check these labs. If normal may empirically try sumatriptan abortive to see if this could be cyclic vomiting syndrome. Counseled on marijuana cessation. -f/u bmp,TSH,A1c -stop marijuana -consider sumatriptan for cyclic vomiting if labs normal

## 2022-06-06 NOTE — Addendum Note (Signed)
Addended by: Edwyna Perfect on: 06/06/2022 02:25 PM   Modules accepted: Orders, Level of Service

## 2022-06-06 NOTE — Progress Notes (Signed)
Internal Medicine Clinic Attending  Case discussed with Dr. Stann Mainland  At the time of the visit.  We reviewed the resident's history and exam and pertinent patient test results.  I agree with the assessment, diagnosis, and plan of care documented in the resident's note.

## 2022-06-06 NOTE — Progress Notes (Signed)
   I connected with  Edward [name] on 06/06/22 by telephone and verified that I am speaking with the correct person using two identifiers.   I discussed the limitations of evaluation and management by telemedicine. The patient expressed understanding and agreed to proceed.  CC: N/V  This is a telephone encounter between [place] and Edward [name] on 06/06/2022 for N/V. The visit was conducted with the patient located at home and Edward [name] at [facility]. The patient's identity was confirmed using their DOB and current address. The patient has consented to being evaluated through a telephone encounter and understands the associated risks (an examination cannot be done and the patient may need to come in for an appointment) / benefits (allows the patient to remain at home, decreasing exposure to coronavirus). I personally spent 15 minutes on medical discussion.   HPI:  Edward [name] is a 52 y.o. with PMH as below.   Please see A&P for assessment of the patient's acute and chronic medical conditions.   Past Medical History:  Diagnosis Date   Bronchitis    GERD (gastroesophageal reflux disease)    Review of Systems:    Assessment & Plan:   N&V (nausea and vomiting) Patient endorses around 2 years total of episodic N/V. Occurs around 2 times per month, sometimes once per week, 2-3 days at a time. Nausea typically more prominent than vomiting but does have frothy green vomit when he does vomit. No blood or whole food in his vomit. All unrelated to food intake, can't eat for days when this happens. Weight loss from 217lbs 01/2022 to 205 lbs 05/2022.Gets hot and diaphoretic during episodes. Not always having abdominal pain but does get some cramping with this. Occasional diarrhea but not always and no constipation. Denies headaches when this happen. Hot showers sometimes help but not always. No significant NSAID use, just occasional ibuprofen. Has tried zofran, promethazine, metoclopramide and  doesn't help. Smokes marijuana 2-3 times daily and has stopped for up to 10 weeks with persistence of vomiting. Has had multiple visits with GI, no evidence of cause from colonoscopy or EGD in 2023. Not infectious given the time course. Not likely CNS given no abnormal neuro findings in the past and no vertiginous symptoms. No headaches so not likely abdominal migraine. None of his meds seem to be attributable to this. No elevated inflammatory markers in past to suggest IBD. Diarrhea and constipation not primary components so less likely IBS. Does have anxiety/ depression but this would be diagnosis of exclusion. Never tested for celiac but prominent N/V with no bloating, no effect on bowel movement and not food related makes this less likely. No electrolyte or BP issues to suggest adrenal insufficiency.Has had elevated glucose on lab testing before, could reflect diabetes although it has not been significantly elevated. Could be thyroid. Will check these labs. If normal may empirically try sumatriptan abortive to see if this could be cyclic vomiting syndrome. Counseled on marijuana cessation. -f/u bmp,TSH,A1c -stop marijuana -consider sumatriptan for cyclic vomiting if labs normal   Patient discussed with Dr. Barth Kirks, MD Internal Medicine Resident, PGY-1 [facility]  Pager: [phone]

## 2022-06-06 NOTE — Progress Notes (Deleted)
Doing well on Suboxone 8-2 mg 2 tablets daily. 1 in the morning and 1/2 in afternoon and evening. No cravings, relapses or withdrawals with this. Last UDS in November is appropriate. Will continue on current subxone dosing and extend follow up to 3 months.    Colonoscopy- dec 7th/8th Throwing up, stays for a few days Hot and clammy Nausea medicine doesn't help 2-3 times monthly 2-3 days Nausea primarily, when vomit is mainly green Not food related 2-3 days no eating, weight loss Marijuana helps  Stopped 1 month, no resolution  Daily smoking Abdominal cramps center Some burning Recent egd? Ibuprofen seldom 2 years, seen GI Diarrhea sometimes with this  Staying sick   A1c, TSH  Sumatriptan??? PPIprotonix 40 Promethazine Zofran metoclopramide

## 2022-06-07 ENCOUNTER — Other Ambulatory Visit (INDEPENDENT_AMBULATORY_CARE_PROVIDER_SITE_OTHER): Payer: 59

## 2022-06-07 ENCOUNTER — Other Ambulatory Visit (HOSPITAL_COMMUNITY): Payer: Self-pay

## 2022-06-07 DIAGNOSIS — R739 Hyperglycemia, unspecified: Secondary | ICD-10-CM

## 2022-06-07 DIAGNOSIS — R1114 Bilious vomiting: Secondary | ICD-10-CM

## 2022-06-07 LAB — POCT GLYCOSYLATED HEMOGLOBIN (HGB A1C): Hemoglobin A1C: 5.1 % (ref 4.0–5.6)

## 2022-06-07 LAB — GLUCOSE, CAPILLARY: Glucose-Capillary: 108 mg/dL — ABNORMAL HIGH (ref 70–99)

## 2022-06-07 NOTE — Telephone Encounter (Signed)
Patients spouse crystal called she stated rx for suboxone was sent in incorrectly she stated patient is supposed to be taking 1 tablet in the morning and 1 tablet at night instead of 1/2 , please address rx.

## 2022-06-08 LAB — BMP8+ANION GAP
Anion Gap: 15 mmol/L (ref 10.0–18.0)
BUN/Creatinine Ratio: 14 (ref 9–20)
BUN: 13 mg/dL (ref 6–24)
CO2: 21 mmol/L (ref 20–29)
Calcium: 8.7 mg/dL (ref 8.7–10.2)
Chloride: 101 mmol/L (ref 96–106)
Creatinine, Ser: 0.96 mg/dL (ref 0.76–1.27)
Glucose: 112 mg/dL — ABNORMAL HIGH (ref 70–99)
Potassium: 3.9 mmol/L (ref 3.5–5.2)
Sodium: 137 mmol/L (ref 134–144)
eGFR: 97 mL/min/{1.73_m2} (ref >59)

## 2022-06-08 LAB — TSH: TSH: 0.997 u[IU]/mL (ref 0.450–4.500)

## 2022-06-08 MED ORDER — SUMATRIPTAN 20 MG/ACT NA SOLN: 20.0000 mg | NASAL | 0 refills | Status: DC | PRN

## 2022-06-08 NOTE — Telephone Encounter (Signed)
Explained to patient's wife that it is fine if patient wants to take 1 tab in AM and 1 tab in PM instead of doing 2 half tabs. Number of tabs is the same. She was very Patent attorney.

## 2022-06-08 NOTE — Addendum Note (Signed)
Addended by: [name] on: 06/08/2022 09:21 AM   Modules accepted: Orders

## 2022-06-08 NOTE — Progress Notes (Signed)
Called patient and discussed normal thyroid, A1c, kidney function and electrolyte labs in the setting on N/V. Will start sumitripatan 57m intranasal to be taken within 30-45 minuest of N/V episode, repeated up to 1 time and with a max of 6 doses per week,as abortive therapy to see if this could be a cyclic vomiting syndrome. Discussed how to take the med and to discontinue if no relief. Patient takes duloxetine but this is not a typical serotonin syndrome causing medication so he should be able to continue this. No other med interactions. No cardiovascular disease either.

## 2022-06-08 NOTE — Telephone Encounter (Signed)
If he wants to take 1 tablet twice a day instead of 1 tablet in the morning and 1/2 tab in the afternoon and evening, that is fine with me. Overall amount should be the same.

## 2022-06-13 ENCOUNTER — Other Ambulatory Visit (HOSPITAL_COMMUNITY): Payer: Self-pay

## 2022-06-13 ENCOUNTER — Other Ambulatory Visit: Payer: Self-pay

## 2022-06-13 DIAGNOSIS — F1190 Opioid use, unspecified, uncomplicated: Secondary | ICD-10-CM

## 2022-06-13 MED ORDER — BUPRENORPHINE HCL-NALOXONE HCL 8-2 MG SL SUBL: SUBLINGUAL_TABLET | SUBLINGUAL | 0 refills | Status: DC

## 2022-06-13 NOTE — Telephone Encounter (Signed)
Please address rx patient stated rx was supposed to go to  [id number] [place] [place] - [address]

## 2022-06-16 ENCOUNTER — Encounter: Payer: Self-pay | Admitting: Radiology

## 2022-06-22 ENCOUNTER — Ambulatory Visit: Payer: 59 | Admitting: Gastroenterology

## 2022-06-22 ENCOUNTER — Encounter: Payer: Self-pay | Admitting: Gastroenterology

## 2022-06-22 VITALS — BP 170/99 | HR 71 | Temp 98.1°F | Ht 69.0 in | Wt 201.0 lb

## 2022-06-22 DIAGNOSIS — R634 Abnormal weight loss: Secondary | ICD-10-CM

## 2022-06-22 DIAGNOSIS — R197 Diarrhea, unspecified: Secondary | ICD-10-CM | POA: Insufficient documentation

## 2022-06-22 DIAGNOSIS — R112 Nausea with vomiting, unspecified: Secondary | ICD-10-CM

## 2022-06-22 NOTE — Patient Instructions (Addendum)
Your blood pressure was elevated today in the office. It has been elevated in the past several times as well. Please follow up with your PCP.  Please complete your labs. If labs come back negative, we will check special urine tests and ultrasound next. Your labs need to be done between 8am-10am FASTING after midnight.

## 2022-06-22 NOTE — Progress Notes (Signed)
GI Office Note    Referring Provider: [name], MD Primary Care Physician:  [name], MD  Primary Gastroenterologist: Elon Alas. Abbey Chatters, DO   Chief Complaint   Chief Complaint  Patient presents with   Follow-up    Has been vomiting and has had diarrhea for the last two days.     History of Present Illness   Edward [name] is a 52 y.o. male presenting today for follow up. Last seen in office in 02/2022. H/o GERD, N/V.    Recurrent N/V, abdominal cramping. Can do well for several weeks at a time and then have symptoms for days. Symptoms thought to be related to cannabinoid hyperemesis syndrome initially. Patient states he quit marijuana for 10 weeks and had worsening of symptoms. Taking Reglan at least twice per day sometimes up to four times. Uses promethazine, Zofran prn. Typically has abdominal cramping in center of abdomen with episodes.   Patient states current episode started two days ago. He usually has egg burps prior to onset of symptoms. Heartburn will get bad. 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 Has taken almost an entire bottle of Pepto in the past several days. His stools are black. He notes new symptoms, has had diarrhea this time. TNTC. Felt fine Monday, ate out at [employer]. Denies overeating. Ate broccoli and beef, fried won ton, egg rolls, crab. Woke up at 4am with N/V. Continues to take pantoprazole '40mg'$  BID. At baseline, stools regular 2-3 times per day. No brbpr. No fever.   Continues to smoke marijuana 3-4 times per day or more.    Work is flexible but has trouble getting in 40 hours per week due to recurrent symptoms. Stressed about his finances.    06/22/22: 201 pounds 05/19/22: 205 pounds 03/25/22: 207 pounds 09/2021:  204 pounds 06/2021: 230 pounds   TCS 03/2022: -non-bleeding internal hemorrhoids -one 46m polyp removed from ascending colon -five 4-628mpolyps removed from sigmoid and trv colon -tubular adenomas, next  colonoscopy in 3 years.   EGD 09/2021 - Z-line regular, 42 cm from the incisors. - Gastritis. Biopsied. - Normal duodenal bulb, first portion of the duodenum and second portion of the duodenum. -gastric mucosa benign with no H.pylori   CT abdomen pelvis with contrast June 2023: Diffuse bladder wall thickening possibly cystitis.  Pancreas unremarkable.    Medications   Current Outpatient Medications  Medication Sig Dispense Refill   acetaminophen (TYLENOL) 500 MG tablet Take 1,000-1,500 mg by mouth daily as needed for headache or moderate pain.     buprenorphine-naloxone (SUBOXONE) 8-2 mg SUBL SL tablet Take 1 tablet in the morning, 1/2 tablet in the afternoon, and 1/2 tablet in the evening 60 tablet 0   DULoxetine (CYMBALTA) 30 MG capsule TAKE 2 CAPSULES BY MOUTH EVERY DAY 180 capsule 4   gabapentin (NEURONTIN) 300 MG capsule Take 1 capsule (300 mg total) by mouth 2 (two) times daily. 90 capsule 5   ibuprofen (ADVIL) 200 MG tablet Take 400-600 mg by mouth daily as needed for moderate pain or headache.     metoCLOPramide (REGLAN) 5 MG tablet TAKE 1 TABLET BY MOUTH EVERY 6 HOURS AS NEEDED FOR NAUSEA. 120 tablet 0   ondansetron (ZOFRAN) 4 MG tablet Take 1 tablet (4 mg total) by mouth every 8 (eight) hours as needed. 60 tablet 0   pantoprazole (PROTONIX) 40 MG tablet Take 1 tablet (40 mg total) by mouth 2 (two) times daily before a meal. 60 tablet 11  promethazine (PHENERGAN) 12.5 MG tablet Take 12.5 mg by mouth every 6 (six) hours as needed for nausea or vomiting.     No current facility-administered medications for this visit.    Allergies   Allergies as of 06/22/2022   (No Known Allergies)     Review of Systems   General: Negative for anorexia,  fever, chills, fatigue, weakness. See hpi ENT: Negative for hoarseness, difficulty swallowing , nasal congestion. CV: Negative for chest pain, angina, palpitations, dyspnea on exertion, peripheral edema.  Respiratory: Negative for  dyspnea at rest, dyspnea on exertion, cough, sputum, wheezing.  GI: See history of present illness. GU:  Negative for dysuria, hematuria, urinary incontinence, urinary frequency, nocturnal urination.  Endo: Negative for unusual weight change.     Physical Exam   BP (!) 170/99 (BP Location: Right Arm, Patient Position: Sitting, Cuff Size: Large)   Pulse 71   Temp 98.1 F (36.7 C) (Oral)   Ht '5\' 9"'$  (1.753 m)   Wt 201 lb (91.2 kg)   SpO2 96%   BMI 29.68 kg/m    General: Well-nourished, well-developed in no acute distress.  Eyes: No icterus. Mouth: Oropharyngeal mucosa moist and pink , no lesions erythema or exudate. Lungs: Clear to auscultation bilaterally.  Heart: Regular rate and rhythm, no murmurs rubs or gallops.  Abdomen: Bowel sounds are normal, nontender, nondistended, no hepatosplenomegaly or masses,  no abdominal bruits or hernia , no rebound or guarding.  Rectal: not performed Extremities: No lower extremity edema. No clubbing or deformities. Neuro: Alert and oriented x 4   Skin: Warm and dry, no jaundice.   Psych: Alert and cooperative, normal mood and affect.  Labs   Lab Results  Component Value Date   HGBA1C 5.1 06/07/2022   Lab Results  Component Value Date   TSH 0.997 06/07/2022    Imaging Studies   No results found.  Assessment   N/V: recurrent episodes initially cannabinoid hyperemesis syndrome suspected, patient denies improvement with 10 weeks of marijuana cessation. Cannot exclude gastroparesis in setting of suboxone use. Obtaining GES in the future could be considered but at this time he is on daily suboxone and reglan. Continues to have intermittent episodes. If GES in the future, consider while on suboxone. At this time will start evaluating for other options including adrenal insufficiency. He has episodes of flushing and most recent episode of diarrhea, so may need to consider carcinoid tumor. For now continue supportive measures. Encouraged  marijuana cessation.    The patient was found to have elevated blood pressure when vital signs were checked in the office. The blood pressure was rechecked by the nursing staff and it was found be persistently elevated >140/90 mmHg. I personally advised to the patient to follow up closely with his PCP for hypertension control.   PLAN   Labs to check for celiac disease, alpha-gal, adrenal insufficiency. If negative, consider screening for carcinoid tumor.  If labs neg, consider RUQ U/S.  Continue PPI BID, Reglan prn (discussed risks of tardive dyskinesia), phenergan/zofran prn.   Laureen Ochs. Bobby Rumpf, [place], [facility]

## 2022-06-23 ENCOUNTER — Telehealth: Payer: Self-pay

## 2022-06-23 DIAGNOSIS — R112 Nausea with vomiting, unspecified: Secondary | ICD-10-CM

## 2022-06-23 NOTE — Telephone Encounter (Signed)
Pt's wife called stating that the pt has been vomiting since last night non stop and has been unable to keep the nausea meds down. Wife is wanting to know if suppositories can be sent in since he is unable to keep the oral ones down.

## 2022-06-24 ENCOUNTER — Encounter: Payer: Self-pay | Admitting: Student

## 2022-06-24 ENCOUNTER — Other Ambulatory Visit (HOSPITAL_COMMUNITY): Payer: Self-pay

## 2022-06-24 ENCOUNTER — Ambulatory Visit (INDEPENDENT_AMBULATORY_CARE_PROVIDER_SITE_OTHER): Payer: 59 | Admitting: Student

## 2022-06-24 VITALS — BP 130/81 | HR 75 | Temp 98.6°F | Ht 69.0 in | Wt 196.3 lb

## 2022-06-24 DIAGNOSIS — F1190 Opioid use, unspecified, uncomplicated: Secondary | ICD-10-CM | POA: Diagnosis not present

## 2022-06-24 DIAGNOSIS — R112 Nausea with vomiting, unspecified: Secondary | ICD-10-CM | POA: Diagnosis not present

## 2022-06-24 MED ORDER — BUPRENORPHINE HCL-NALOXONE HCL 8-2 MG SL SUBL
1.0000 | SUBLINGUAL_TABLET | Freq: Two times a day (BID) | SUBLINGUAL | 1 refills | Status: DC
  Filled 2022-06-24 (×2): qty 28, 14d supply, fill #0

## 2022-06-24 MED ORDER — BUPRENORPHINE HCL-NALOXONE HCL 8-2 MG SL SUBL
SUBLINGUAL_TABLET | SUBLINGUAL | 1 refills | Status: DC
  Filled 2022-06-24: qty 28, fill #0

## 2022-06-24 MED ORDER — PROMETHAZINE HCL 12.5 MG RE SUPP: 12.5000 mg | Freq: Four times a day (QID) | RECTAL | 0 refills | Status: AC | PRN

## 2022-06-24 NOTE — Telephone Encounter (Signed)
I have sent in phenergan suppositories. If persistent N/V and not able to at least stay well hydrated, needs ER evaluation.

## 2022-06-24 NOTE — Assessment & Plan Note (Signed)
Patient continues to be worked up with gastroenterology.  Will continue to follow labs.  Plan: -Follow-up GI labs

## 2022-06-24 NOTE — Assessment & Plan Note (Signed)
Patient currently on 8-2 mg Suboxone 2 tablets daily with 1 in the morning and 1 in the evening.  Patient states recently his medication has become very hard to afford.  He states that a month supply is costing him $150.  He states he would like to go to a different pharmacy to see if this will be cheaper.  I offered him an IM program through our services in which she can get Suboxone through the outpatient [facility] pharmacy given patient is not insured.  Patient is agreeable to this plan.  He denies any cravings, relapses, or withdrawals.  Patient does report that he has been getting Suboxone from a body, and per PDMP review he has not picked up Suboxone since January.  Plan: -UDS at next visit -Suboxone 8-2 mg twice daily -Follow-up in 1 month

## 2022-06-24 NOTE — Patient Instructions (Signed)
Sina Sloane Cleckler, Thank you for allowing me to take part in your care today.  Here are your instructions.  1.  I have refilled your Suboxone, and sent it to the outpatient pharmacy at [facility].  Please pick this up from your pharmacy. Remember this is a 2-week prescription.  You will have to pick it up every 2 weeks.  Thank you, Dr. Posey Pronto  If you have any other questions please contact the internal medicine clinic at [phone]

## 2022-06-24 NOTE — Progress Notes (Signed)
CC: OUD follow up  HPI:  [name] is a 52 y.o. presenting of for OUD F/U.  Please see assessment and plan for full HPI.  Patient concerned that the suboxone is no longer able to afford is suboxone. Patient has been having trouble getting his subonxe as it has bee unafforable. He reports has been getting suboxone from a buddy.   No withdrawal symptoms. Has been taking 1 a day. Not able to ttake the dose he needs to be taking.   Checked PDMP 05/02/22 30 day supply  Last UDS [facility] 02/18/22   Past Medical History:  Diagnosis Date   Bronchitis    GERD (gastroesophageal reflux disease)      Current Outpatient Medications:    acetaminophen (TYLENOL) 500 MG tablet, Take 1,000-1,500 mg by mouth daily as needed for headache or moderate pain., Disp: , Rfl:    buprenorphine-naloxone (SUBOXONE) 8-2 mg SUBL SL tablet, Place 1 tablet under the tongue 2 (two) times daily - morning and evening, Disp: 28 tablet, Rfl: 1   DULoxetine (CYMBALTA) 30 MG capsule, TAKE 2 CAPSULES BY MOUTH EVERY DAY, Disp: 180 capsule, Rfl: 4   gabapentin (NEURONTIN) 300 MG capsule, Take 1 capsule (300 mg total) by mouth 2 (two) times daily., Disp: 90 capsule, Rfl: 5   ibuprofen (ADVIL) 200 MG tablet, Take 400-600 mg by mouth daily as needed for moderate pain or headache., Disp: , Rfl:    metoCLOPramide (REGLAN) 5 MG tablet, TAKE 1 TABLET BY MOUTH EVERY 6 HOURS AS NEEDED FOR NAUSEA., Disp: 120 tablet, Rfl: 0   ondansetron (ZOFRAN) 4 MG tablet, Take 1 tablet (4 mg total) by mouth every 8 (eight) hours as needed., Disp: 60 tablet, Rfl: 0   pantoprazole (PROTONIX) 40 MG tablet, Take 1 tablet (40 mg total) by mouth 2 (two) times daily before a meal., Disp: 60 tablet, Rfl: 11   promethazine (PHENERGAN) 12.5 MG suppository, Place 1 suppository (12.5 mg total) rectally every 6 (six) hours as needed for nausea or vomiting., Disp: 12 each, Rfl: 0   promethazine (PHENERGAN) 12.5 MG tablet, Take 12.5 mg by mouth every 6 (six)  hours as needed for nausea or vomiting., Disp: , Rfl:   Review of Systems:   Negative except for what is stated in HPI  Physical Exam:  Vitals:   06/24/22 0934 06/24/22 0944  BP: (!) 146/83 130/81  Pulse: 79 75  Temp: 98.6 F (37 C)   TempSrc: Oral   SpO2: 99%   Weight: 196 lb 4.8 oz (89 kg)   Height: '5\' 9"'$  (1.753 m)     General: Patient is sitting comfortably in the room  Eyes: EOM intact  Head: Normocephalic, atraumatic  Cardio: Regular rate and rhythm, no murmurs, rubs or gallops. 2+ pulses to bilateral upper and lower extremities  Pulmonary: Clear to ausculation bilaterally with no rales, rhonchi, and crackles   Assessment & Plan:   Opioid use disorder Patient currently on 8-2 mg Suboxone 2 tablets daily with 1 in the morning and 1 in the evening.  Patient states recently his medication has become very hard to afford.  He states that a month supply is costing him $150.  He states he would like to go to a different pharmacy to see if this will be cheaper.  I offered him an IM program through our services in which she can get Suboxone through the outpatient [facility] pharmacy given patient is not insured.  Patient is agreeable to this plan.  He  denies any cravings, relapses, or withdrawals.  Patient does report that he has been getting Suboxone from a body, and per PDMP review he has not picked up Suboxone since January.  Plan: -UDS at next visit -Suboxone 8-2 mg twice daily -Follow-up in 1 month  N&V (nausea and vomiting) Patient continues to be worked up with gastroenterology.  Will continue to follow labs.  Plan: -Follow-up GI labs  Patient seen with Dr. Glenice Bow, DO PGY-1 Internal Medicine Resident  Pager: [phone]

## 2022-06-24 NOTE — Addendum Note (Signed)
Addended by: Aliene Altes on: 06/24/2022 11:20 AM   Modules accepted: Orders

## 2022-06-24 NOTE — Telephone Encounter (Signed)
Lmom notifying pt 

## 2022-06-28 ENCOUNTER — Other Ambulatory Visit: Payer: Self-pay

## 2022-06-28 ENCOUNTER — Other Ambulatory Visit: Payer: Self-pay | Admitting: Gastroenterology

## 2022-06-28 DIAGNOSIS — R1116 Cannabis hyperemesis syndrome: Secondary | ICD-10-CM

## 2022-06-28 DIAGNOSIS — R112 Nausea with vomiting, unspecified: Secondary | ICD-10-CM

## 2022-06-28 DIAGNOSIS — R197 Diarrhea, unspecified: Secondary | ICD-10-CM

## 2022-06-28 MED ORDER — POTASSIUM CHLORIDE CRYS ER 20 MEQ PO TBCR: 40.0000 meq | EXTENDED_RELEASE_TABLET | Freq: Two times a day (BID) | ORAL | 0 refills | Status: DC

## 2022-06-30 NOTE — Progress Notes (Signed)
Internal Medicine Clinic Attending  Case discussed with Dr. Patel at the time of the visit.  We reviewed the resident's history and exam and pertinent patient test results.  I agree with the assessment, diagnosis, and plan of care documented in the resident's note.  

## 2022-07-01 ENCOUNTER — Other Ambulatory Visit: Payer: Self-pay | Admitting: Gastroenterology

## 2022-07-01 LAB — CBC WITH DIFFERENTIAL/PLATELET
Absolute Monocytes: 310 cells/uL (ref 200–950)
Basophils Absolute: 20 cells/uL (ref 0–200)
Basophils Relative: 0.4 %
Eosinophils Absolute: 90 cells/uL (ref 15–500)
Eosinophils Relative: 1.8 %
HCT: 44.8 % (ref 38.5–50.0)
Hemoglobin: 15.4 g/dL (ref 13.2–17.1)
Lymphs Abs: 1610 cells/uL (ref 850–3900)
MCH: 27.7 pg (ref 27.0–33.0)
MCHC: 34.4 g/dL (ref 32.0–36.0)
MCV: 80.6 fL (ref 80.0–100.0)
MPV: 10.2 fL (ref 7.5–12.5)
Monocytes Relative: 6.2 %
Neutro Abs: 2970 cells/uL (ref 1500–7800)
Neutrophils Relative %: 59.4 %
Platelets: 268 10*3/uL (ref 140–400)
RBC: 5.56 10*6/uL (ref 4.20–5.80)
RDW: 14 % (ref 11.0–15.0)
Total Lymphocyte: 32.2 %
WBC: 5 10*3/uL (ref 3.8–10.8)

## 2022-07-01 LAB — ALPHA-GAL PANEL
Allergen, Mutton, f88: <0.1 kU/L
Allergen, Pork, f26: <0.1 kU/L
Beef: <0.1 kU/L
CLASS: 0
CLASS: 0
Class: 0
GALACTOSE-ALPHA-1,3-GALACTOSE IGE*: <0.1 kU/L (ref <0.10)

## 2022-07-01 LAB — COMPREHENSIVE METABOLIC PANEL
AG Ratio: 1.6 (calc) (ref 1.0–2.5)
ALT: 10 U/L (ref 9–46)
AST: 11 U/L (ref 10–40)
Albumin: 4.6 g/dL (ref 3.6–5.1)
Alkaline phosphatase (APISO): 83 U/L (ref 36–130)
BUN: 13 mg/dL (ref 7–25)
CO2: 26 mmol/L (ref 20–32)
Calcium: 9.4 mg/dL (ref 8.6–10.3)
Chloride: 102 mmol/L (ref 98–110)
Creat: 0.85 mg/dL (ref 0.60–1.29)
Globulin: 2.8 g/dL (calc) (ref 1.9–3.7)
Glucose, Bld: 137 mg/dL — ABNORMAL HIGH (ref 65–99)
Potassium: 3.1 mmol/L — ABNORMAL LOW (ref 3.5–5.3)
Sodium: 140 mmol/L (ref 135–146)
Total Bilirubin: 0.9 mg/dL (ref 0.2–1.2)
Total Protein: 7.4 g/dL (ref 6.1–8.1)

## 2022-07-01 LAB — IGA: Immunoglobulin A: 187 mg/dL (ref 47–310)

## 2022-07-01 LAB — TISSUE TRANSGLUTAMINASE, IGA: (tTG) Ab, IgA: <1 U/mL

## 2022-07-01 LAB — CORTISOL-AM, BLOOD: Cortisol - AM: 48.8 ug/dL — ABNORMAL HIGH

## 2022-07-01 LAB — INTERPRETATION:

## 2022-07-11 DIAGNOSIS — R197 Diarrhea, unspecified: Secondary | ICD-10-CM | POA: Diagnosis not present

## 2022-07-11 DIAGNOSIS — F1290 Cannabis use, unspecified, uncomplicated: Secondary | ICD-10-CM | POA: Diagnosis not present

## 2022-07-11 DIAGNOSIS — R112 Nausea with vomiting, unspecified: Secondary | ICD-10-CM | POA: Diagnosis not present

## 2022-07-12 LAB — BASIC METABOLIC PANEL
BUN: 12 mg/dL (ref 7–25)
CO2: 30 mmol/L (ref 20–32)
Calcium: 9.2 mg/dL (ref 8.6–10.3)
Chloride: 104 mmol/L (ref 98–110)
Creat: 0.97 mg/dL (ref 0.60–1.29)
Glucose, Bld: 109 mg/dL — ABNORMAL HIGH (ref 65–99)
Potassium: 4.7 mmol/L (ref 3.5–5.3)
Sodium: 140 mmol/L (ref 135–146)

## 2022-07-19 ENCOUNTER — Other Ambulatory Visit: Payer: Self-pay | Admitting: *Deleted

## 2022-07-19 DIAGNOSIS — R7989 Other specified abnormal findings of blood chemistry: Secondary | ICD-10-CM

## 2022-07-20 ENCOUNTER — Encounter: Payer: Self-pay | Admitting: Orthopaedic Surgery

## 2022-07-20 ENCOUNTER — Ambulatory Visit (INDEPENDENT_AMBULATORY_CARE_PROVIDER_SITE_OTHER): Payer: 59 | Admitting: Orthopaedic Surgery

## 2022-07-20 VITALS — Ht 69.0 in | Wt 196.0 lb

## 2022-07-20 DIAGNOSIS — M87051 Idiopathic aseptic necrosis of right femur: Secondary | ICD-10-CM

## 2022-07-20 NOTE — Progress Notes (Signed)
Office Visit Note   Patient: Edward [name]           Date of Birth: 11-02-1970           MRN: [id number] Visit Date: 07/20/2022              Requested by: [name], MD [address] [place],  [place] [address] PCP: [name], MD   Assessment & Plan: Visit Diagnoses:  1. Avascular necrosis of right femoral head     Plan: Patient has known AVN without collapse by MRI scan and plain radiograph.  Left x-rays were in January.  I will recheck him in 3 weeks if he is having some ongoing problems with his hip we will repeat standing AP and frog-leg x-ray right hip.  He may have some synovitis that the prednisone for his pulmonary problem is taken care of.  Follow-Up Instructions: Return in about 3 weeks (around 08/10/2022).   Orders:  No orders of the defined types were placed in this encounter.  No orders of the defined types were placed in this encounter.     Procedures: No procedures performed   Clinical Data: No additional findings.   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  He had pulmonary problems and is on inhalers and is on prednisone for pulmonary problems.  He had over a year of symptomatic left hip AVN which gradually resolved and now is having significant problems with his right hip and states last week he could walk.  He had been on prednisone for 2 days and states now his hip is doing well and is not having the pain around the trochanter and in the right groin.  No pain that radiates past his knee.  Previous MRI scan showed a little bit of disc bulge at L1 to otherwise no compression the scan was several years ago.  Review of Systems all systems are noncontributory HPI.  Patient is on chronic Suboxone by [facility].   Objective: Vital Signs: Ht 5\' 9"  (1.753 m)   Wt 196 lb (88.9 kg)   BMI 28.94 kg/m    Physical Exam Constitutional:      Appearance: He is well-developed.  HENT:     Head: Normocephalic and atraumatic.     Right Ear: External ear normal.     Left Ear: External ear normal.  Eyes:     Pupils: Pupils are equal, round, and reactive to light.  Neck:     Thyroid: No thyromegaly.     Trachea: No tracheal deviation.  Cardiovascular:     Rate and Rhythm: Normal rate.  Pulmonary:     Effort: Pulmonary effort is normal.     Breath sounds: No wheezing.  Abdominal:     General: Bowel sounds are normal.     Palpations: Abdomen is soft.  Musculoskeletal:     Cervical back: Neck supple.  Skin:    General: Skin is warm and dry.     Capillary Refill: Capillary refill takes less than 2 seconds.  Neurological:     Mental Status: He is alert and oriented to person, place, and time.  Psychiatric:        Behavior: Behavior normal.        Thought Content: Thought content normal.  Judgment: Judgment normal.     Ortho Exam right minimal discomfort internal/external rotation right hip leg lengths equal negative straight leg raising 90 degrees knee and ankle jerk are intact he is able to ambulate without hip limp.  Specialty Comments:  No specialty comments available.  Imaging: No results found.   PMFS History: Patient Active Problem List   Diagnosis Date Noted   Diarrhea 06/22/2022   Loss of weight 06/22/2022   Hx of colonic polyps 03/26/2022   N&V (nausea and vomiting) 03/07/2022   Colon cancer screening 03/07/2022   Lipoma 02/18/2022   Hypertension 02/18/2022   Radicular pain of left lower extremity 01/20/2022   Healthcare maintenance 01/20/2022   Cannabinoid hyperemesis syndrome 10/06/2021   Abdominal pain, chronic, epigastric 10/06/2021   GERD (gastroesophageal reflux disease) 10/06/2021   Avascular necrosis of femoral head 06/15/2021   Opioid use disorder 05/19/2021   Chronic lower back pain 05/19/2021   Anxiety and depression 05/19/2021   Past  Medical History:  Diagnosis Date   Bronchitis    GERD (gastroesophageal reflux disease)     Family History  Problem Relation Age of Onset   Diabetes Mother    Heart disease Father    Prostate cancer Father     Past Surgical History:  Procedure Laterality Date   BIOPSY  09/20/2021   Procedure: BIOPSY;  Surgeon: [name], DO;  Location: [facility];  Service: Endoscopy;;   COLONOSCOPY WITH PROPOFOL N/A 03/21/2022   Procedure: COLONOSCOPY WITH PROPOFOL;  Surgeon: [name], DO;  Location: [facility];  Service: Endoscopy;  Laterality: N/A;  2:00 pm   ESOPHAGOGASTRODUODENOSCOPY (EGD) WITH PROPOFOL N/A 09/20/2021   Procedure: ESOPHAGOGASTRODUODENOSCOPY (EGD) WITH PROPOFOL;  Surgeon: [name], DO;  Location: [facility];  Service: Endoscopy;  Laterality: N/A;  12:15pm   POLYPECTOMY  03/21/2022   Procedure: POLYPECTOMY;  Surgeon: [name], DO;  Location: [facility];  Service: Endoscopy;;   Social History   Occupational History   Not on file  Tobacco Use   Smoking status: Never    Passive exposure: Current   Smokeless tobacco: Never  Vaping Use   Vaping Use: Never used  Substance and Sexual Activity   Alcohol use: Not Currently   Drug use: Yes    Frequency: 7.0 times per week    Types: Marijuana   Sexual activity: Yes

## 2022-07-21 ENCOUNTER — Ambulatory Visit: Payer: Self-pay | Admitting: Orthopaedic Surgery

## 2022-08-01 DIAGNOSIS — R6889 Other general symptoms and signs: Secondary | ICD-10-CM | POA: Diagnosis not present

## 2022-08-01 DIAGNOSIS — R3989 Other symptoms and signs involving the genitourinary system: Secondary | ICD-10-CM | POA: Diagnosis not present

## 2022-08-01 DIAGNOSIS — R7989 Other specified abnormal findings of blood chemistry: Secondary | ICD-10-CM | POA: Diagnosis not present

## 2022-08-01 DIAGNOSIS — M87051 Idiopathic aseptic necrosis of right femur: Secondary | ICD-10-CM | POA: Diagnosis not present

## 2022-08-01 DIAGNOSIS — R109 Unspecified abdominal pain: Secondary | ICD-10-CM | POA: Diagnosis not present

## 2022-08-01 DIAGNOSIS — R002 Palpitations: Secondary | ICD-10-CM | POA: Diagnosis not present

## 2022-08-01 DIAGNOSIS — R231 Pallor: Secondary | ICD-10-CM | POA: Diagnosis not present

## 2022-08-01 DIAGNOSIS — R1110 Vomiting, unspecified: Secondary | ICD-10-CM | POA: Diagnosis not present

## 2022-08-01 DIAGNOSIS — M87052 Idiopathic aseptic necrosis of left femur: Secondary | ICD-10-CM | POA: Diagnosis not present

## 2022-08-01 DIAGNOSIS — M6281 Muscle weakness (generalized): Secondary | ICD-10-CM | POA: Diagnosis not present

## 2022-08-05 DIAGNOSIS — R7989 Other specified abnormal findings of blood chemistry: Secondary | ICD-10-CM | POA: Diagnosis not present

## 2022-08-05 DIAGNOSIS — R002 Palpitations: Secondary | ICD-10-CM | POA: Diagnosis not present

## 2022-08-05 DIAGNOSIS — R231 Pallor: Secondary | ICD-10-CM | POA: Diagnosis not present

## 2022-08-10 ENCOUNTER — Ambulatory Visit: Payer: Self-pay | Admitting: Orthopaedic Surgery

## 2022-08-19 DIAGNOSIS — R6889 Other general symptoms and signs: Secondary | ICD-10-CM | POA: Diagnosis not present

## 2022-08-19 DIAGNOSIS — Z8349 Family history of other endocrine, nutritional and metabolic diseases: Secondary | ICD-10-CM | POA: Diagnosis not present

## 2022-08-19 DIAGNOSIS — R1110 Vomiting, unspecified: Secondary | ICD-10-CM | POA: Diagnosis not present

## 2022-08-19 DIAGNOSIS — R7303 Prediabetes: Secondary | ICD-10-CM | POA: Diagnosis not present

## 2022-08-19 DIAGNOSIS — Z833 Family history of diabetes mellitus: Secondary | ICD-10-CM | POA: Diagnosis not present

## 2022-08-19 DIAGNOSIS — M87051 Idiopathic aseptic necrosis of right femur: Secondary | ICD-10-CM | POA: Diagnosis not present

## 2022-08-19 DIAGNOSIS — R7301 Impaired fasting glucose: Secondary | ICD-10-CM | POA: Diagnosis not present

## 2022-08-19 DIAGNOSIS — R109 Unspecified abdominal pain: Secondary | ICD-10-CM | POA: Diagnosis not present

## 2022-08-19 DIAGNOSIS — E663 Overweight: Secondary | ICD-10-CM | POA: Diagnosis not present

## 2022-08-19 DIAGNOSIS — R231 Pallor: Secondary | ICD-10-CM | POA: Diagnosis not present

## 2022-08-25 ENCOUNTER — Telehealth: Payer: Self-pay

## 2022-08-25 NOTE — Telephone Encounter (Signed)
Called patient and advised him regarding previous message.

## 2022-08-25 NOTE — Telephone Encounter (Signed)
Patients spouse called she is requesting a rx refill for sumatriptan(Imitrex). Medication was discontinued 06/22/22 by Zada Finders CMA and says the prescription was never filled. Are you able to send in rx for the patient?

## 2022-09-02 ENCOUNTER — Other Ambulatory Visit: Payer: Self-pay

## 2022-09-02 DIAGNOSIS — R1114 Bilious vomiting: Secondary | ICD-10-CM

## 2022-09-02 DIAGNOSIS — R1115 Cyclical vomiting syndrome unrelated to migraine: Secondary | ICD-10-CM

## 2022-09-06 ENCOUNTER — Other Ambulatory Visit: Payer: Self-pay | Admitting: Gastroenterology

## 2022-09-15 ENCOUNTER — Encounter: Payer: Self-pay | Admitting: *Deleted

## 2022-09-23 ENCOUNTER — Other Ambulatory Visit: Payer: Self-pay

## 2022-09-23 ENCOUNTER — Encounter: Payer: Self-pay | Admitting: Student

## 2022-09-23 ENCOUNTER — Ambulatory Visit (INDEPENDENT_AMBULATORY_CARE_PROVIDER_SITE_OTHER): Payer: 59 | Admitting: Student

## 2022-09-23 VITALS — BP 142/85 | HR 69 | Temp 98.3°F | Ht 69.0 in | Wt 195.3 lb

## 2022-09-23 DIAGNOSIS — F1190 Opioid use, unspecified, uncomplicated: Secondary | ICD-10-CM

## 2022-09-23 DIAGNOSIS — I10 Essential (primary) hypertension: Secondary | ICD-10-CM

## 2022-09-23 MED ORDER — BUPRENORPHINE HCL-NALOXONE HCL 8-2 MG SL SUBL: 1.0000 | SUBLINGUAL_TABLET | Freq: Two times a day (BID) | SUBLINGUAL | 0 refills | Status: AC

## 2022-09-23 MED ORDER — AMLODIPINE BESYLATE 5 MG PO TABS: 5.0000 mg | ORAL_TABLET | Freq: Every day | ORAL | 11 refills | Status: DC

## 2022-09-23 MED ORDER — BUPRENORPHINE HCL-NALOXONE HCL 8-2 MG SL SUBL: 1.0000 | SUBLINGUAL_TABLET | Freq: Two times a day (BID) | SUBLINGUAL | 0 refills | Status: DC

## 2022-09-23 NOTE — Assessment & Plan Note (Signed)
Patient with history  of elevated blood pressure in clinic returning today for OUD treamtent monitoring and noted to have elevated BP again Vitals:   09/23/22 0849 09/23/22 0920  BP: (!) 154/91 (!) 142/85   Discussed with patient lifestyle modifications, BP monitoring techniques at home, and risks of chronically elevated blood pressure. After shared decision making, will start patient on low dose amlodipine. - Amlodipine 5 mg start today - BP log and blood pressure cuff at next OV

## 2022-09-23 NOTE — Assessment & Plan Note (Addendum)
Patient has been adherent to 2x 8-2 mg daily, one in the morning and one at night. Patient's medication was sent as 30 tablets the last two refills by mistake as his 30 day supply is 60 tablets. Last 15 day refill was dispensed on 09/20/2022. He has not run out of medications.  Denies withdrawal symptoms. No cravings since starting medication. Chronic lower back pain has been controlled. PHQ2 today 0.  Discussed with patient need for Toxassure today and return to clinic in one month for close OUD follow ups as he just recently re-established with [place]. Other than prescribed medications, THC/Marijuana products are expected. PDMP reviewed. Will order Suboxone 60 tablets for the next 30 days to be taken as follows 1 in the AM and 1 in the PM. Will date the refill 6/19 as patient should have coverage until then. - Suboxone 8-2 mg, 60 tablets for 30 days - ToxAssure today  - Return to clinic in one month

## 2022-09-23 NOTE — Progress Notes (Signed)
Subjective:  CC: OUD follow up and refill  HPI:  Edward [name] is a 52 y.o. male with a past medical history stated below and presents today for OUD follow up. Please see problem based assessment and plan for additional details.  Past Medical History:  Diagnosis Date   Bronchitis    GERD (gastroesophageal reflux disease)     Current Outpatient Medications on File Prior to Visit  Medication Sig Dispense Refill   acetaminophen (TYLENOL) 500 MG tablet Take 1,000-1,500 mg by mouth daily as needed for headache or moderate pain.     DULoxetine (CYMBALTA) 30 MG capsule TAKE 2 CAPSULES BY MOUTH EVERY DAY 180 capsule 4   gabapentin (NEURONTIN) 300 MG capsule Take 1 capsule (300 mg total) by mouth 2 (two) times daily. 90 capsule 5   ibuprofen (ADVIL) 200 MG tablet Take 400-600 mg by mouth daily as needed for moderate pain or headache.     metoCLOPramide (REGLAN) 5 MG tablet TAKE 1 TABLET BY MOUTH EVERY 6 HOURS AS NEEDED FOR NAUSEA 120 tablet 0   ondansetron (ZOFRAN) 4 MG tablet TAKE 1 TABLET BY MOUTH EVERY 8 HOURS AS NEEDED 60 tablet 1   pantoprazole (PROTONIX) 40 MG tablet Take 1 tablet (40 mg total) by mouth 2 (two) times daily before a meal. 60 tablet 11   potassium chloride SA (KLOR-CON M) 20 MEQ tablet Take 2 tablets (40 mEq total) by mouth 2 (two) times daily for 3 days. 12 tablet 0   promethazine (PHENERGAN) 12.5 MG suppository Place 1 suppository (12.5 mg total) rectally every 6 (six) hours as needed for nausea or vomiting. 12 each 0   promethazine (PHENERGAN) 12.5 MG tablet Take 12.5 mg by mouth every 6 (six) hours as needed for nausea or vomiting.     SUMAtriptan (IMITREX) 20 MG/ACT nasal spray PLACE 1 SPRAY (20 MG TOTAL) INTO THE NOSE EVERY 2 (TWO) HOURS AS NEEDED FOR MIGRAINE OR HEADACHE. TO BE ADMINISTERED DURING THE PRODROME OR SHORTLY AFTER VOMITING BEGINS (EG, WITHIN 30 TO 45 MINUTES).MAY REPEAT IN 2 HOURS IF VOMITING PERSISTS OR RECURS. NO MORE THAN 6 DOSES / WEEK. 6 each 2    No current facility-administered medications on file prior to visit.    Family History  Problem Relation Age of Onset   Diabetes Mother    Heart disease Father    Prostate cancer Father     Social History   Socioeconomic History   Marital status: Married    Spouse name: Not on file   Number of children: Not on file   Years of education: Not on file   Highest education level: Not on file  Occupational History   Not on file  Tobacco Use   Smoking status: Never    Passive exposure: Current   Smokeless tobacco: Never  Vaping Use   Vaping Use: Never used  Substance and Sexual Activity   Alcohol use: Not Currently   Drug use: Yes    Types: Marijuana   Sexual activity: Yes  Other Topics Concern   Not on file  Social History Narrative   Not on file   Social Determinants of Health   Financial Resource Strain: Not on file  Food Insecurity: No Food Insecurity (03/25/2022)   Hunger Vital Sign    Worried About Running Out of Food in the Last Year: Never true    Ran Out of Food in the Last Year: Never true  Transportation Needs: No Transportation Needs (03/25/2022)  PRAPARE - Administrator, Civil Service (Medical): No    Lack of Transportation (Non-Medical): No  Physical Activity: Not on file  Stress: Not on file  Social Connections: Socially Integrated (03/25/2022)   Social Connection and Isolation Panel [NHANES]    Frequency of Communication with Friends and Family: Three times a week    Frequency of Social Gatherings with Friends and Family: More than three times a week    Attends Religious Services: More than 4 times per year    Active Member of [employer] or Organizations: Not on file    Attends Banker Meetings: 1 to 4 times per year    Marital Status: Married  Catering manager Violence: Not At Risk (03/25/2022)   Humiliation, Afraid, Rape, and Kick questionnaire    Fear of Current or Ex-Partner: No    Emotionally Abused: No    Physically  Abused: No    Sexually Abused: No    Review of Systems: ROS negative except for what is noted on the assessment and plan.  Objective:   Vitals:   09/23/22 0849 09/23/22 0920  BP: (!) 154/91 (!) 142/85  Pulse: 84 69  Temp: 98.3 F (36.8 C)   TempSrc: Oral   SpO2: 100%   Weight: 195 lb 4.8 oz (88.6 kg)   Height: 5\' 9"  (1.753 m)     Physical Exam: Constitutional: well-appearing man sitting in exam chair, in no acute distress HENT: normocephalic atraumatic, mucous membranes moist Eyes: conjunctiva non-erythematous Neck: supple Cardiovascular: regular rate and rhythm, no m/r/g Pulmonary/Chest: normal work of breathing on room air, lungs clear to auscultation bilaterally Abdominal: soft, non-tender, non-distended MSK: normal bulk and tone Neurological: alert & oriented x 3, 5/5 strength in bilateral upper and lower extremities, normal gait Skin: warm and dry Psych: pleasant mood and affect       09/23/2022    8:51 AM  Depression screen PHQ 2/9  Decreased Interest 0  Down, Depressed, Hopeless 0  PHQ - 2 Score 0     Assessment & Plan:   Opioid use disorder Patient has been adherent to 2x 8-2 mg daily, one in the morning and one at night. Patient's medication was sent as 30 tablets the last two refills by mistake as his 30 day supply is 60 tablets. Last 15 day refill was dispensed on 09/20/2022. He has not run out of medications.  Denies withdrawal symptoms. No cravings since starting medication. Chronic lower back pain has been controlled. PHQ2 today 0.  Discussed with patient need for Toxassure today and return to clinic in one month for close OUD follow ups as he just recently re-established with [place]. Other than prescribed medications, THC/Marijuana products are expected. PDMP reviewed. Will order Suboxone 60 tablets for the next 30 days to be taken as follows 1 in the AM and 1 in the PM. Will date the refill 6/19 as patient should have coverage until then. - Suboxone 8-2 mg, 60  tablets for 30 days - ToxAssure today  - Return to clinic in one month  Hypertension Patient with history  of elevated blood pressure in clinic returning today for OUD treamtent monitoring and noted to have elevated BP again Vitals:   09/23/22 0849 09/23/22 0920  BP: (!) 154/91 (!) 142/85   Discussed with patient lifestyle modifications, BP monitoring techniques at home, and risks of chronically elevated blood pressure. After shared decision making, will start patient on low dose amlodipine. - Amlodipine 5 mg start today - BP  log and blood pressure cuff at next OV    Return in about 4 weeks (around 10/21/2022) for DM and OUD.  Patient discussed with Dr. Rosalia Hammers, MD [facility] Medicine Program - PGY-1 09/23/2022, 11:26 AM

## 2022-09-23 NOTE — Patient Instructions (Signed)
Thank you, [name] for allowing [place] to provide your care today. Today we discussed your treatment for opiod use, hypertension.    Remember to follow up with your gastroenterologist (nausea doctor) and with your orthopedic surgeon -  New medications: - amdolipine  - take one 5 mg tablet every day BP cuff from [facility] Bring the blood pressure log at next visit  Blood pressure tips   It is best to check your BP 1-2 hours after taking your medications to see the medications effectiveness on your BP.    Here are some tips that our clinical pharmacists share for home BP monitoring:          Rest 10 minutes before taking your blood pressure.          Don't smoke or drink caffeinated beverages for at least 30 minutes before.          Take your blood pressure before (not after) you eat.          Sit comfortably with your back supported and both feet on the floor (don't cross your legs).          Elevate your arm to heart level on a table or a desk.          Use the proper sized cuff. It should fit smoothly and snugly around your bare upper arm. There should be enough room to slip a fingertip under the cuff. The bottom edge of the cuff should be 1 inch above the crease of the elbow.   I have ordered the following labs for you:   Lab Orders         ToxAssure Select,+Antidepr,UR      I will call if any are abnormal. All of your labs can be accessed through "My Chart".   My Chart Access: [email or web address]  Please follow-up in: 1 month    We look forward to seeing you next time. Please call our clinic at [phone] if you have any questions or concerns. The best time to call is Monday-Friday from 9am-4pm, but there is someone available 24/7. If after hours or the weekend, call the main hospital number and ask for the Internal Medicine Resident On-Call. If you need medication refills, please notify your pharmacy one week in advance and  they will send [place] a request.   Thank you for letting us take part in your care. Wishing you the best!  Morene Crocker, MD 09/23/2022, 9:14 AM [facility] Internal Medicine Resident, PGY-1

## 2022-09-26 ENCOUNTER — Other Ambulatory Visit: Payer: Self-pay | Admitting: Gastroenterology

## 2022-09-26 NOTE — Progress Notes (Signed)
Internal Medicine Clinic Attending  Case discussed with Dr. Gomez-Caraballo  At the time of the visit.  We reviewed the resident's history and exam and pertinent patient test results.  I agree with the assessment, diagnosis, and plan of care documented in the resident's note.  

## 2022-09-29 LAB — TOXASSURE SELECT,+ANTIDEPR,UR

## 2022-09-30 NOTE — Progress Notes (Signed)
Discussed with patient. The only unexpected result was lack of Duloxetine. Patient assures me he has been taking this medication. Medication dispense history shows he has been receiving it. Advised him to double check medication bottles at home to ensure he is taking the right medication. Please check at next ToxAssure.

## 2022-10-11 DIAGNOSIS — I10 Essential (primary) hypertension: Secondary | ICD-10-CM | POA: Diagnosis not present

## 2022-10-11 DIAGNOSIS — Z0189 Encounter for other specified special examinations: Secondary | ICD-10-CM | POA: Diagnosis not present

## 2022-10-11 DIAGNOSIS — K219 Gastro-esophageal reflux disease without esophagitis: Secondary | ICD-10-CM | POA: Diagnosis not present

## 2022-10-11 DIAGNOSIS — E669 Obesity, unspecified: Secondary | ICD-10-CM | POA: Diagnosis not present

## 2022-10-11 DIAGNOSIS — Z79899 Other long term (current) drug therapy: Secondary | ICD-10-CM | POA: Diagnosis not present

## 2022-10-11 DIAGNOSIS — Z139 Encounter for screening, unspecified: Secondary | ICD-10-CM | POA: Diagnosis not present

## 2022-10-11 DIAGNOSIS — Z6830 Body mass index (BMI) 30.0-30.9, adult: Secondary | ICD-10-CM | POA: Diagnosis not present

## 2022-11-02 DIAGNOSIS — Z139 Encounter for screening, unspecified: Secondary | ICD-10-CM | POA: Diagnosis not present

## 2022-11-08 DIAGNOSIS — K219 Gastro-esophageal reflux disease without esophagitis: Secondary | ICD-10-CM | POA: Diagnosis not present

## 2022-11-08 DIAGNOSIS — E785 Hyperlipidemia, unspecified: Secondary | ICD-10-CM | POA: Diagnosis not present

## 2022-11-08 DIAGNOSIS — I10 Essential (primary) hypertension: Secondary | ICD-10-CM | POA: Diagnosis not present

## 2022-11-08 DIAGNOSIS — E669 Obesity, unspecified: Secondary | ICD-10-CM | POA: Diagnosis not present

## 2022-11-08 DIAGNOSIS — Z6830 Body mass index (BMI) 30.0-30.9, adult: Secondary | ICD-10-CM | POA: Diagnosis not present

## 2022-11-08 DIAGNOSIS — E782 Mixed hyperlipidemia: Secondary | ICD-10-CM | POA: Diagnosis not present

## 2022-11-08 DIAGNOSIS — D649 Anemia, unspecified: Secondary | ICD-10-CM | POA: Diagnosis not present

## 2022-11-08 DIAGNOSIS — G43909 Migraine, unspecified, not intractable, without status migrainosus: Secondary | ICD-10-CM | POA: Diagnosis not present

## 2022-11-08 DIAGNOSIS — Z713 Dietary counseling and surveillance: Secondary | ICD-10-CM | POA: Diagnosis not present

## 2022-11-08 DIAGNOSIS — Z79899 Other long term (current) drug therapy: Secondary | ICD-10-CM | POA: Diagnosis not present

## 2022-11-15 ENCOUNTER — Encounter: Payer: Self-pay | Admitting: Gastroenterology

## 2022-11-15 ENCOUNTER — Ambulatory Visit (INDEPENDENT_AMBULATORY_CARE_PROVIDER_SITE_OTHER): Payer: 59 | Admitting: Gastroenterology

## 2022-11-15 ENCOUNTER — Telehealth: Payer: Self-pay | Admitting: *Deleted

## 2022-11-15 VITALS — BP 147/81 | HR 76 | Temp 98.0°F | Ht 71.0 in | Wt 193.2 lb

## 2022-11-15 DIAGNOSIS — R112 Nausea with vomiting, unspecified: Secondary | ICD-10-CM

## 2022-11-15 DIAGNOSIS — K219 Gastro-esophageal reflux disease without esophagitis: Secondary | ICD-10-CM

## 2022-11-15 DIAGNOSIS — R1084 Generalized abdominal pain: Secondary | ICD-10-CM

## 2022-11-15 DIAGNOSIS — R1011 Right upper quadrant pain: Secondary | ICD-10-CM | POA: Diagnosis not present

## 2022-11-15 DIAGNOSIS — R634 Abnormal weight loss: Secondary | ICD-10-CM | POA: Diagnosis not present

## 2022-11-15 NOTE — Telephone Encounter (Signed)
[employer] PA: Pending  Case Number: [id number] Unable to schedule CT at this time.

## 2022-11-15 NOTE — Patient Instructions (Signed)
CT scan of your abdomen today or early tomorrow.  Complete labs.  Continue metoclopramide, ondansetron for nausea.  Continue pantoprazole twice daily for reflux. You can add OTC antacids like TUMS, Pepcid as needed for reflux.  If your symptoms worse, develop fever, cannot keep down liquid please go to the ER.

## 2022-11-15 NOTE — Progress Notes (Unsigned)
GI Office Note    Referring Provider: No ref. provider found Primary Care Physician:  Patient, No Pcp Per  Primary Gastroenterologist: Hennie Duos. Marletta Lor, DO   Chief Complaint   Chief Complaint  Patient presents with   Abdominal Pain    Abdominal pain and nausea     History of Present Illness   Edward [name] is a 52 y.o. male presenting today for urgent office visit.  Last seen in March.  He has a history of GERD, nausea/vomiting, abdominal cramping.  Symptoms initially thought to be related to cannabinoid hyperemesis syndrome.  He reported quitting marijuana for 10 weeks and had worsening of symptoms.  Was taking Reglan twice daily sometimes up to 4 times daily, using promethazine and/or Zofran as needed.  Prior to onset of symptoms, typically gets egg burps.  Heartburn will get bad.  At time of last ov, he was smoking marijuana 3-4 times per day or more.  Gastroparesis also in the differential in the setting of Suboxone use.  He has seen endocrinology for elevated cortisol level and work up negative for Cushing's disease.  Lab negative for symptomatic paraganglioma-pheochromocytoma, porphyria, lupus.   Labs from March 2024: High cortisol level 48.8, referred to endocrinology.  Etiology and significance unclear.  Alpha gal negative.  LFTs were normal.  CBC normal.  Celiac screen negative.  Wt Readings from Last 18 Encounters:  11/15/22 193 lb 3.2 oz (87.6 kg)  09/23/22 195 lb 4.8 oz (88.6 kg)  07/20/22 196 lb (88.9 kg)  06/24/22 196 lb 4.8 oz (89 kg)  06/22/22 201 lb (91.2 kg)  05/19/22 205 lb (93 kg)  03/25/22 207 lb 9.6 oz (94.2 kg)  03/16/22 212 lb 3.2 oz (96.3 kg)  03/07/22 212 lb 3.2 oz (96.3 kg)  02/24/22 212 lb (96.2 kg)  02/18/22 212 lb 1.6 oz (96.2 kg)  01/20/22 217 lb 1.6 oz (98.5 kg)  10/06/21 204 lb 14.4 oz (92.9 kg)  09/14/21 221 lb 12.8 oz (100.6 kg)  09/01/21 218 lb (98.9 kg)  08/17/21 227 lb 3.2 oz (103.1 kg)  07/20/21 228 lb (103.4 kg)   07/01/21 230 lb (104.3 kg)    Started blood pressure. Nausea for week before vomiting this ime. More hot/cold sweats. Stomach cramping terrible. Usually once per month more lately once per week. Last episode one month ago. Little diarrhea.  Trying to drink water. Middle abd cramping. Doubles over. No fever. Urinated ok. No melena, brbpr. Marijuana, 2 per day.       TCS 03/2022: -non-bleeding internal hemorrhoids -one 13mm polyp removed from ascending colon -five 4-8mm polyps removed from sigmoid and trv colon -tubular adenomas, next colonoscopy in 3 years.    EGD 09/2021 - Z-line regular, 42 cm from the incisors. - Gastritis. Biopsied. - Normal duodenal bulb, first portion of the duodenum and second portion of the duodenum. -gastric mucosa benign with no H.pylori   CT abdomen pelvis with contrast June 2023: Diffuse bladder wall thickening possibly cystitis.  Pancreas unremarkable.   Medications   Current Outpatient Medications  Medication Sig Dispense Refill   acetaminophen (TYLENOL) 500 MG tablet Take 1,000-1,500 mg by mouth daily as needed for headache or moderate pain.     amLODipine (NORVASC) 5 MG tablet Take 1 tablet (5 mg total) by mouth daily. 30 tablet 11   buprenorphine-naloxone (SUBOXONE) 8-2 mg SUBL SL tablet SMARTSIG:1 Tablet(s) Sublingual Morning-Evening     DULoxetine (CYMBALTA) 30 MG capsule TAKE 2 CAPSULES BY MOUTH EVERY DAY  180 capsule 4   gabapentin (NEURONTIN) 300 MG capsule Take 1 capsule (300 mg total) by mouth 2 (two) times daily. 90 capsule 5   ibuprofen (ADVIL) 200 MG tablet Take 400-600 mg by mouth daily as needed for moderate pain or headache.     metoCLOPramide (REGLAN) 5 MG tablet TAKE 1 TABLET BY MOUTH EVERY 6 HOURS AS NEEDED FOR NAUSEA 120 tablet 0   ondansetron (ZOFRAN) 4 MG tablet TAKE 1 TABLET BY MOUTH EVERY 8 HOURS AS NEEDED 60 tablet 1   pantoprazole (PROTONIX) 40 MG tablet Take 1 tablet (40 mg total) by mouth 2 (two) times daily  before a meal. 60 tablet 11   potassium chloride SA (KLOR-CON M) 20 MEQ tablet Take 2 tablets (40 mEq total) by mouth 2 (two) times daily for 3 days. 12 tablet 0   promethazine (PHENERGAN) 12.5 MG suppository Place 1 suppository (12.5 mg total) rectally every 6 (six) hours as needed for nausea or vomiting. 12 each 0   promethazine (PHENERGAN) 12.5 MG tablet Take 12.5 mg by mouth every 6 (six) hours as needed for nausea or vomiting.     SUMAtriptan (IMITREX) 20 MG/ACT nasal spray PLACE 1 SPRAY (20 MG TOTAL) INTO THE NOSE EVERY 2 (TWO) HOURS AS NEEDED FOR MIGRAINE OR HEADACHE. TO BE ADMINISTERED DURING THE PRODROME OR SHORTLY AFTER VOMITING BEGINS (EG, WITHIN 30 TO 45 MINUTES).MAY REPEAT IN 2 HOURS IF VOMITING PERSISTS OR RECURS. NO MORE THAN 6 DOSES / WEEK. 6 each 2   No current facility-administered medications for this visit.    Allergies   Allergies as of 11/15/2022   (No Known Allergies)     Past Medical History   Past Medical History:  Diagnosis Date   Bronchitis    GERD (gastroesophageal reflux disease)     Past Surgical History   Past Surgical History:  Procedure Laterality Date   BIOPSY  09/20/2021   Procedure: BIOPSY;  Surgeon: Lanelle Bal, DO;  Location: [facility];  Service: Endoscopy;;   COLONOSCOPY WITH PROPOFOL N/A 03/21/2022   Procedure: COLONOSCOPY WITH PROPOFOL;  Surgeon: Lanelle Bal, DO;  Location: [facility];  Service: Endoscopy;  Laterality: N/A;  2:00 pm   ESOPHAGOGASTRODUODENOSCOPY (EGD) WITH PROPOFOL N/A 09/20/2021   Procedure: ESOPHAGOGASTRODUODENOSCOPY (EGD) WITH PROPOFOL;  Surgeon: Lanelle Bal, DO;  Location: [facility];  Service: Endoscopy;  Laterality: N/A;  12:15pm   POLYPECTOMY  03/21/2022   Procedure: POLYPECTOMY;  Surgeon: Lanelle Bal, DO;  Location: [facility];  Service: Endoscopy;;    Past Family History   Family History  Problem Relation Age of Onset   Diabetes Mother    Heart disease Father    Prostate  cancer Father     Past Social History   Social History   Socioeconomic History   Marital status: Married    Spouse name: Not on file   Number of children: Not on file   Years of education: Not on file   Highest education level: Not on file  Occupational History   Not on file  Tobacco Use   Smoking status: Never    Passive exposure: Current   Smokeless tobacco: Never  Vaping Use   Vaping status: Never Used  Substance and Sexual Activity   Alcohol use: Not Currently   Drug use: Yes    Types: Marijuana   Sexual activity: Yes  Other Topics Concern   Not on file  Social History Narrative   Not on file  Social Determinants of Health   Financial Resource Strain: Not on file  Food Insecurity: No Food Insecurity (03/25/2022)   Hunger Vital Sign    Worried About Running Out of Food in the Last Year: Never true    Ran Out of Food in the Last Year: Never true  Transportation Needs: No Transportation Needs (03/25/2022)   PRAPARE - Administrator, Civil Service (Medical): No    Lack of Transportation (Non-Medical): No  Physical Activity: Not on file  Stress: Not on file  Social Connections: Socially Integrated (03/25/2022)   Social Connection and Isolation Panel [NHANES]    Frequency of Communication with Friends and Family: Three times a week    Frequency of Social Gatherings with Friends and Family: More than three times a week    Attends Religious Services: More than 4 times per year    Active Member of [employer] or Organizations: Not on file    Attends Banker Meetings: 1 to 4 times per year    Marital Status: Married  Catering manager Violence: Not At Risk (03/25/2022)   Humiliation, Afraid, Rape, and Kick questionnaire    Fear of Current or Ex-Partner: No    Emotionally Abused: No    Physically Abused: No    Sexually Abused: No    Review of Systems   General: Negative for anorexia, fever, chills, fatigue, weakness. Weight loss as noted  above ENT: Negative for hoarseness, difficulty swallowing , nasal congestion. CV: Negative for chest pain, angina, palpitations, dyspnea on exertion, peripheral edema.  Respiratory: Negative for dyspnea at rest, dyspnea on exertion, cough, sputum, wheezing.  GI: See history of present illness. GU:  Negative for dysuria, hematuria, urinary incontinence, urinary frequency, nocturnal urination.  Endo: Negative for unusual weight change.     Physical Exam   BP (!) 151/85 (BP Location: Right Arm, Patient Position: Sitting, Cuff Size: Normal)   Pulse 74   Temp 98 F (36.7 C) (Oral)   Ht 5\' 11"  (1.803 m)   Wt 193 lb 3.2 oz (87.6 kg)   SpO2 98%   BMI 26.95 kg/m    General: appears acutely ill, bent over with pain.  Eyes: No icterus. Mouth: Oropharyngeal mucosa moist and pink  Abdomen: Bowel sounds are normal,  nondistended, no hepatosplenomegaly or masses, no abdominal bruits or hernia. Moderate ruq/epig tenderness with deep palpation, no rebound or guarding.  Rectal: not performed Extremities: No lower extremity edema. No clubbing or deformities. Neuro: Alert and oriented x 4   Skin: Warm and dry, no jaundice.   Psych: Alert and cooperative, normal mood and affect.  Labs   *** Imaging Studies   No results found.  Assessment       PLAN   ***   Leanna Battles. Melvyn Neth, MHS, PA-C [facility]

## 2022-11-16 ENCOUNTER — Other Ambulatory Visit (HOSPITAL_COMMUNITY)
Admission: RE | Admit: 2022-11-16 | Discharge: 2022-11-16 | Disposition: A | Discharge status: Home / Self Care | Payer: 59 | Source: Ambulatory Visit | Attending: Gastroenterology | Admitting: Gastroenterology

## 2022-11-16 ENCOUNTER — Ambulatory Visit (HOSPITAL_COMMUNITY): Admission: RE | Admit: 2022-11-16 | Payer: 59 | Source: Ambulatory Visit

## 2022-11-16 DIAGNOSIS — K219 Gastro-esophageal reflux disease without esophagitis: Secondary | ICD-10-CM | POA: Insufficient documentation

## 2022-11-16 DIAGNOSIS — R1084 Generalized abdominal pain: Secondary | ICD-10-CM | POA: Diagnosis not present

## 2022-11-16 DIAGNOSIS — R1011 Right upper quadrant pain: Secondary | ICD-10-CM | POA: Diagnosis not present

## 2022-11-16 DIAGNOSIS — R112 Nausea with vomiting, unspecified: Secondary | ICD-10-CM | POA: Insufficient documentation

## 2022-11-16 LAB — COMPREHENSIVE METABOLIC PANEL
ALT: 13 U/L (ref 0–44)
AST: 14 U/L — ABNORMAL LOW (ref 15–41)
Albumin: 4.1 g/dL (ref 3.5–5.0)
Alkaline Phosphatase: 82 U/L (ref 38–126)
Anion gap: 9 (ref 5–15)
BUN: 15 mg/dL (ref 6–20)
CO2: 25 mmol/L (ref 22–32)
Calcium: 8.9 mg/dL (ref 8.9–10.3)
Chloride: 100 mmol/L (ref 98–111)
Creatinine, Ser: 0.9 mg/dL (ref 0.61–1.24)
GFR, Estimated: >60 mL/min (ref >60)
Glucose, Bld: 130 mg/dL — ABNORMAL HIGH (ref 70–99)
Potassium: 3.4 mmol/L — ABNORMAL LOW (ref 3.5–5.1)
Sodium: 134 mmol/L — ABNORMAL LOW (ref 135–145)
Total Bilirubin: 1.2 mg/dL (ref 0.3–1.2)
Total Protein: 7.3 g/dL (ref 6.5–8.1)

## 2022-11-16 LAB — CBC WITH DIFFERENTIAL/PLATELET
Abs Immature Granulocytes: 0.01 10*3/uL (ref 0.00–0.07)
Basophils Absolute: 0 10*3/uL (ref 0.0–0.1)
Basophils Relative: 0 %
Eosinophils Absolute: 0.1 10*3/uL (ref 0.0–0.5)
Eosinophils Relative: 2 %
HCT: 40.7 % (ref 39.0–52.0)
Hemoglobin: 14 g/dL (ref 13.0–17.0)
Immature Granulocytes: 0 %
Lymphocytes Relative: 26 %
Lymphs Abs: 1.5 10*3/uL (ref 0.7–4.0)
MCH: 28.8 pg (ref 26.0–34.0)
MCHC: 34.4 g/dL (ref 30.0–36.0)
MCV: 83.7 fL (ref 80.0–100.0)
Monocytes Absolute: 0.5 10*3/uL (ref 0.1–1.0)
Monocytes Relative: 8 %
Neutro Abs: 3.8 10*3/uL (ref 1.7–7.7)
Neutrophils Relative %: 64 %
Platelets: 235 10*3/uL (ref 150–400)
RBC: 4.86 MIL/uL (ref 4.22–5.81)
RDW: 13.1 % (ref 11.5–15.5)
WBC: 5.9 10*3/uL (ref 4.0–10.5)
nRBC: 0 % (ref 0.0–0.2)

## 2022-11-16 LAB — LIPASE, BLOOD: Lipase: 30 U/L (ref 11–51)

## 2022-11-16 NOTE — Telephone Encounter (Signed)
[employer] PA: New case #[id number], PA still pending.

## 2022-11-17 ENCOUNTER — Encounter: Payer: Self-pay | Admitting: *Deleted

## 2022-11-17 NOTE — Telephone Encounter (Signed)
[employer] PA: Approval # [id number] DOS: 11/15/22-05/15/23

## 2022-11-17 NOTE — Telephone Encounter (Signed)
Called pt and informed him that he could go to [facility] today, he would just have to walk in by 2:30 pm.  He stated he was back at work and needed a late afternoon appointment.  He is now scheduled for 11/28/22 at 3 pm. Jodelle Green, the scheduler, says she will put him on a cancellation list or if he decides to, he can just walk in. FYI

## 2022-11-23 NOTE — Telephone Encounter (Signed)
noted 

## 2022-11-26 ENCOUNTER — Other Ambulatory Visit: Payer: Self-pay | Admitting: Gastroenterology

## 2022-11-28 ENCOUNTER — Ambulatory Visit
Admission: RE | Admit: 2022-11-28 | Discharge: 2022-11-28 | Disposition: A | Discharge status: Home / Self Care | Payer: 59 | Source: Ambulatory Visit | Attending: Gastroenterology | Admitting: Gastroenterology

## 2022-11-28 DIAGNOSIS — K219 Gastro-esophageal reflux disease without esophagitis: Secondary | ICD-10-CM | POA: Diagnosis not present

## 2022-11-28 DIAGNOSIS — R112 Nausea with vomiting, unspecified: Secondary | ICD-10-CM | POA: Diagnosis not present

## 2022-11-28 DIAGNOSIS — R1084 Generalized abdominal pain: Secondary | ICD-10-CM | POA: Diagnosis not present

## 2022-11-28 DIAGNOSIS — R1011 Right upper quadrant pain: Secondary | ICD-10-CM | POA: Diagnosis not present

## 2022-11-28 MED ORDER — IOPAMIDOL (ISOVUE-300) INJECTION 61%
100.0000 mL | Freq: Once | INTRAVENOUS | Status: AC | PRN
  Administered 2022-11-28: 100 mL via INTRAVENOUS

## 2022-12-07 ENCOUNTER — Telehealth: Payer: Self-pay

## 2022-12-07 NOTE — Telephone Encounter (Signed)
Pt called wanting to know recent imaging results.

## 2022-12-08 NOTE — Telephone Encounter (Signed)
See hpi

## 2022-12-09 ENCOUNTER — Other Ambulatory Visit: Payer: Self-pay

## 2022-12-09 ENCOUNTER — Other Ambulatory Visit: Payer: Self-pay | Admitting: Gastroenterology

## 2022-12-09 DIAGNOSIS — R1084 Generalized abdominal pain: Secondary | ICD-10-CM

## 2022-12-12 ENCOUNTER — Encounter: Payer: Self-pay | Admitting: *Deleted

## 2022-12-12 ENCOUNTER — Other Ambulatory Visit: Payer: Self-pay | Admitting: *Deleted

## 2022-12-12 DIAGNOSIS — R1084 Generalized abdominal pain: Secondary | ICD-10-CM

## 2022-12-12 DIAGNOSIS — R112 Nausea with vomiting, unspecified: Secondary | ICD-10-CM

## 2022-12-13 ENCOUNTER — Other Ambulatory Visit: Payer: Self-pay | Admitting: Student

## 2022-12-13 DIAGNOSIS — F1190 Opioid use, unspecified, uncomplicated: Secondary | ICD-10-CM

## 2022-12-15 ENCOUNTER — Encounter (HOSPITAL_COMMUNITY)
Admission: RE | Admit: 2022-12-15 | Discharge: 2022-12-15 | Disposition: A | Discharge status: Home / Self Care | Payer: 59 | Source: Ambulatory Visit | Attending: Gastroenterology | Admitting: Gastroenterology

## 2022-12-15 DIAGNOSIS — R112 Nausea with vomiting, unspecified: Secondary | ICD-10-CM | POA: Diagnosis not present

## 2022-12-15 DIAGNOSIS — R1084 Generalized abdominal pain: Secondary | ICD-10-CM | POA: Diagnosis not present

## 2022-12-15 DIAGNOSIS — R109 Unspecified abdominal pain: Secondary | ICD-10-CM | POA: Diagnosis not present

## 2022-12-15 MED ORDER — SINCALIDE 5 MCG IJ SOLR
INTRAMUSCULAR | Status: AC
  Filled 2022-12-15: qty 5

## 2022-12-15 MED ORDER — SINCALIDE 5 MCG IJ SOLR
0.0200 ug/kg | Freq: Once | INTRAMUSCULAR | Status: AC
  Administered 2022-12-15: 1.7 ug via INTRAVENOUS

## 2022-12-15 MED ORDER — STERILE WATER FOR INJECTION IJ SOLN
INTRAMUSCULAR | Status: AC
  Filled 2022-12-15: qty 10

## 2022-12-15 MED ORDER — TECHNETIUM TC 99M MEBROFENIN IV KIT
5.0000 | PACK | Freq: Once | INTRAVENOUS | Status: AC | PRN
  Administered 2022-12-15: 4.9 via INTRAVENOUS

## 2023-01-03 ENCOUNTER — Other Ambulatory Visit: Payer: Self-pay | Admitting: Gastroenterology

## 2023-01-18 ENCOUNTER — Ambulatory Visit (INDEPENDENT_AMBULATORY_CARE_PROVIDER_SITE_OTHER): Payer: 59 | Admitting: Internal Medicine

## 2023-01-18 ENCOUNTER — Encounter: Payer: Self-pay | Admitting: Internal Medicine

## 2023-01-18 VITALS — BP 129/65 | HR 73 | Temp 97.9°F | Ht 71.0 in | Wt 202.7 lb

## 2023-01-18 DIAGNOSIS — F1190 Opioid use, unspecified, uncomplicated: Secondary | ICD-10-CM | POA: Diagnosis not present

## 2023-01-18 DIAGNOSIS — M5410 Radiculopathy, site unspecified: Secondary | ICD-10-CM

## 2023-01-18 MED ORDER — BUPRENORPHINE HCL-NALOXONE HCL 8-2 MG SL SUBL: 1.0000 | SUBLINGUAL_TABLET | Freq: Two times a day (BID) | SUBLINGUAL | 0 refills | Status: DC

## 2023-01-18 MED ORDER — GABAPENTIN 300 MG PO CAPS
300.0000 mg | ORAL_CAPSULE | Freq: Two times a day (BID) | ORAL | 5 refills | Status: DC
Start: 2023-01-18 — End: 2023-04-21

## 2023-01-18 NOTE — Patient Instructions (Signed)
Thank you, Mr.Sharad E Swigert for allowing [place] to provide your care today. Today we discussed:  Suboxone: I have sent in a refill of your suboxone and gabapentin. Keep taking them both twice a day, as you have been doing. We will see you in 3 months    I have ordered the following labs for you:  Lab Orders  No laboratory test(s) ordered today     Referrals ordered today:   Referral Orders  No referral(s) requested today     I have ordered the following medication/changed the following medications:   Stop the following medications: Medications Discontinued During This Encounter  Medication Reason   gabapentin (NEURONTIN) 300 MG capsule Reorder   buprenorphine-naloxone (SUBOXONE) 8-2 mg SUBL SL tablet Reorder     Start the following medications: Meds ordered this encounter  Medications   buprenorphine-naloxone (SUBOXONE) 8-2 mg SUBL SL tablet    Sig: Place 1 tablet under the tongue in the morning and at bedtime.    Dispense:  60 tablet    Refill:  0    Not to exceed 5 additional fills before 03/22/2023 DX Code Needed  .   gabapentin (NEURONTIN) 300 MG capsule    Sig: Take 1 capsule (300 mg total) by mouth 2 (two) times daily.    Dispense:  90 capsule    Refill:  5    IM Program     Follow up: 3 months     Should you have any questions or concerns please call the internal medicine clinic at [phone].     [name], D.O. [facility]

## 2023-01-18 NOTE — Progress Notes (Signed)
CC: OUD  HPI:  Edward [name] is a 52 y.o. male living with a history stated below and presents today for follow up of his OUD. Please see problem based assessment and plan for additional details.  Past Medical History:  Diagnosis Date   Bronchitis    GERD (gastroesophageal reflux disease)     Current Outpatient Medications on File Prior to Visit  Medication Sig Dispense Refill   acetaminophen (TYLENOL) 500 MG tablet Take 1,000-1,500 mg by mouth daily as needed for headache or moderate pain.     amLODipine (NORVASC) 5 MG tablet Take 1 tablet (5 mg total) by mouth daily. 30 tablet 11   DULoxetine (CYMBALTA) 30 MG capsule TAKE 2 CAPSULES BY MOUTH EVERY DAY 180 capsule 4   ibuprofen (ADVIL) 200 MG tablet Take 400-600 mg by mouth daily as needed for moderate pain or headache.     metoCLOPramide (REGLAN) 5 MG tablet TAKE 1 TABLET BY MOUTH EVERY 6 HOURS AS NEEDED FOR NAUSEA 120 tablet 0   ondansetron (ZOFRAN) 4 MG tablet TAKE 1 TABLET BY MOUTH EVERY 8 HOURS AS NEEDED 60 tablet 1   pantoprazole (PROTONIX) 40 MG tablet Take 1 tablet (40 mg total) by mouth 2 (two) times daily before a meal. 60 tablet 11   potassium chloride SA (KLOR-CON M) 20 MEQ tablet Take 2 tablets (40 mEq total) by mouth 2 (two) times daily for 3 days. 12 tablet 0   promethazine (PHENERGAN) 12.5 MG suppository Place 1 suppository (12.5 mg total) rectally every 6 (six) hours as needed for nausea or vomiting. 12 each 0   promethazine (PHENERGAN) 12.5 MG tablet Take 12.5 mg by mouth every 6 (six) hours as needed for nausea or vomiting.     SUMAtriptan (IMITREX) 20 MG/ACT nasal spray PLACE 1 SPRAY (20 MG TOTAL) INTO THE NOSE EVERY 2 (TWO) HOURS AS NEEDED FOR MIGRAINE OR HEADACHE. TO BE ADMINISTERED DURING THE PRODROME OR SHORTLY AFTER VOMITING BEGINS (EG, WITHIN 30 TO 45 MINUTES).MAY REPEAT IN 2 HOURS IF VOMITING PERSISTS OR RECURS. NO MORE THAN 6 DOSES / WEEK. 6 each 2   No current facility-administered medications on file  prior to visit.    Family History  Problem Relation Age of Onset   Diabetes Mother    Heart disease Father    Prostate cancer Father     Social History   Socioeconomic History   Marital status: Married    Spouse name: Not on file   Number of children: Not on file   Years of education: Not on file   Highest education level: Not on file  Occupational History   Not on file  Tobacco Use   Smoking status: Never    Passive exposure: Current   Smokeless tobacco: Never  Vaping Use   Vaping status: Never Used  Substance and Sexual Activity   Alcohol use: Not Currently   Drug use: Yes    Types: Marijuana   Sexual activity: Yes  Other Topics Concern   Not on file  Social History Narrative   Not on file   Social Determinants of Health   Financial Resource Strain: Not on file  Food Insecurity: No Food Insecurity (03/25/2022)   Hunger Vital Sign    Worried About Running Out of Food in the Last Year: Never true    Ran Out of Food in the Last Year: Never true  Transportation Needs: No Transportation Needs (03/25/2022)   PRAPARE - Transportation    Lack of Transportation (  Medical): No    Lack of Transportation (Non-Medical): No  Physical Activity: Not on file  Stress: Not on file  Social Connections: Socially Integrated (03/25/2022)   Social Connection and Isolation Panel [NHANES]    Frequency of Communication with Friends and Family: Three times a week    Frequency of Social Gatherings with Friends and Family: More than three times a week    Attends Religious Services: More than 4 times per year    Active Member of [employer] or Organizations: Not on file    Attends Banker Meetings: 1 to 4 times per year    Marital Status: Married  Catering manager Violence: Not At Risk (03/25/2022)   Humiliation, Afraid, Rape, and Kick questionnaire    Fear of Current or Ex-Partner: No    Emotionally Abused: No    Physically Abused: No    Sexually Abused: No    Review of  Systems: ROS negative except for what is noted on the assessment and plan.  Vitals:   01/18/23 0951  BP: 129/65  Pulse: 73  Temp: 97.9 F (36.6 C)  TempSrc: Oral  Weight: 202 lb 11.2 oz (91.9 kg)  Height: 5\' 11"  (1.803 m)    Physical Exam: Constitutional: comfortable, sitting in chair Cardiovascular: regular rate and rhythm Pulmonary/Chest: normal work of breathing on room air MSK: normal bulk and tone Skin: warm and dry Psych: normal mood and behavior  Assessment & Plan:    Patient discussed with Dr. Sol Blazing  Opioid use disorder The patient presents for 40-month follow-up of his OUD.  He has been adherent with his Suboxone 8-2 mg twice daily and has been filling it appropriately.  He denies any cravings, relapses, or withdrawal symptoms. Also denies any constipation. He has chronic lower back pain that has been controlled with his dose of Suboxone.  He had a ToxAssure done in June, which was appropriate, thus we do not need to repeat one today.  Refill for Suboxone 8-2 mg, 60 tablets for 30 days. Also sent in refill of his gabapentin. Return to clinic in 3 months.   [name], D.O. [facility] Internal Medicine, PGY-3 Phone: [phone] Date 01/18/2023 Time 10:07 AM

## 2023-01-18 NOTE — Assessment & Plan Note (Addendum)
The patient presents for 69-month follow-up of his OUD.  He has been adherent with his Suboxone 8-2 mg twice daily and has been filling it appropriately.  He denies any cravings, relapses, or withdrawal symptoms. Also denies any constipation. He has chronic lower back pain that has been controlled with his dose of Suboxone.  He had a ToxAssure done in June, which was appropriate, thus we do not need to repeat one today.  Refill for Suboxone 8-2 mg, 60 tablets for 30 days. Also sent in refill of his gabapentin. Return to clinic in 3 months.

## 2023-01-19 NOTE — Progress Notes (Signed)
Internal Medicine Clinic Attending  Case discussed with the resident at the time of the visit.  We reviewed the resident's history and exam and pertinent patient test results.  I agree with the assessment, diagnosis, and plan of care documented in the resident's note.  

## 2023-01-30 ENCOUNTER — Other Ambulatory Visit: Payer: Self-pay | Admitting: Student

## 2023-01-30 DIAGNOSIS — R1115 Cyclical vomiting syndrome unrelated to migraine: Secondary | ICD-10-CM

## 2023-01-30 DIAGNOSIS — R1114 Bilious vomiting: Secondary | ICD-10-CM

## 2023-01-30 NOTE — Telephone Encounter (Signed)
Patient has a private PCP and only sees our clinic for his suboxone

## 2023-03-14 ENCOUNTER — Other Ambulatory Visit: Payer: Self-pay | Admitting: Internal Medicine

## 2023-03-14 DIAGNOSIS — F1190 Opioid use, unspecified, uncomplicated: Secondary | ICD-10-CM

## 2023-04-07 ENCOUNTER — Encounter: Payer: 59 | Admitting: Internal Medicine

## 2023-04-21 ENCOUNTER — Ambulatory Visit: Payer: Self-pay | Admitting: Internal Medicine

## 2023-04-21 ENCOUNTER — Encounter: Payer: Self-pay | Admitting: Internal Medicine

## 2023-04-21 VITALS — BP 136/79 | HR 83 | Temp 98.5°F | Ht 71.0 in | Wt 204.3 lb

## 2023-04-21 DIAGNOSIS — R1114 Bilious vomiting: Secondary | ICD-10-CM

## 2023-04-21 DIAGNOSIS — F1190 Opioid use, unspecified, uncomplicated: Secondary | ICD-10-CM

## 2023-04-21 DIAGNOSIS — Z79891 Long term (current) use of opiate analgesic: Secondary | ICD-10-CM

## 2023-04-21 DIAGNOSIS — I10 Essential (primary) hypertension: Secondary | ICD-10-CM

## 2023-04-21 DIAGNOSIS — F32A Depression, unspecified: Secondary | ICD-10-CM

## 2023-04-21 DIAGNOSIS — R1115 Cyclical vomiting syndrome unrelated to migraine: Secondary | ICD-10-CM

## 2023-04-21 DIAGNOSIS — M5410 Radiculopathy, site unspecified: Secondary | ICD-10-CM

## 2023-04-21 MED ORDER — DULOXETINE HCL 30 MG PO CPEP
60.0000 mg | ORAL_CAPSULE | Freq: Every day | ORAL | 4 refills | Status: DC
Start: 2023-04-21 — End: 2023-06-30

## 2023-04-21 MED ORDER — AMLODIPINE BESYLATE 5 MG PO TABS
5.0000 mg | ORAL_TABLET | Freq: Every day | ORAL | 11 refills | Status: DC
Start: 2023-04-21 — End: 2023-12-27

## 2023-04-21 MED ORDER — PROMETHAZINE HCL 12.5 MG PO TABS: 12.5000 mg | ORAL_TABLET | Freq: Four times a day (QID) | ORAL | 2 refills | Status: AC | PRN

## 2023-04-21 MED ORDER — BUPRENORPHINE HCL-NALOXONE HCL 8-2 MG SL SUBL
1.0000 | SUBLINGUAL_TABLET | Freq: Two times a day (BID) | SUBLINGUAL | 2 refills | Status: DC
Start: 2023-04-21 — End: 2023-09-18

## 2023-04-21 MED ORDER — METOCLOPRAMIDE HCL 5 MG PO TABS: 5.0000 mg | ORAL_TABLET | Freq: Four times a day (QID) | ORAL | 0 refills | Status: AC | PRN

## 2023-04-21 MED ORDER — PANTOPRAZOLE SODIUM 40 MG PO TBEC: 40.0000 mg | DELAYED_RELEASE_TABLET | Freq: Two times a day (BID) | ORAL | 11 refills | Status: AC

## 2023-04-21 MED ORDER — SUMATRIPTAN 20 MG/ACT NA SOLN
20.0000 mg | NASAL | 2 refills | Status: AC | PRN
Start: 2023-04-21 — End: ?

## 2023-04-21 MED ORDER — GABAPENTIN 300 MG PO CAPS
300.0000 mg | ORAL_CAPSULE | Freq: Two times a day (BID) | ORAL | 5 refills | Status: DC
Start: 2023-04-21 — End: 2023-07-10

## 2023-04-21 NOTE — Addendum Note (Signed)
 Addended by: Bufford Spikes on: 04/21/2023 09:55 AM   Modules accepted: Orders

## 2023-04-21 NOTE — Progress Notes (Signed)
    Subjective:  CC: Suboxone   HPI:  [name] is a 53 y.o. male with a past medical history stated below and presents today for above. Please see problem based assessment and plan for additional details.  Past Medical History:  Diagnosis Date   Bronchitis    GERD (gastroesophageal reflux disease)     Current Outpatient Medications on File Prior to Visit  Medication Sig Dispense Refill   acetaminophen  (TYLENOL ) 500 MG tablet Take 1,000-1,500 mg by mouth daily as needed for headache or moderate pain.     ibuprofen (ADVIL) 200 MG tablet Take 400-600 mg by mouth daily as needed for moderate pain or headache.     ondansetron  (ZOFRAN ) 4 MG tablet TAKE 1 TABLET BY MOUTH EVERY 8 HOURS AS NEEDED 60 tablet 1   potassium chloride  SA (KLOR-CON  M) 20 MEQ tablet Take 2 tablets (40 mEq total) by mouth 2 (two) times daily for 3 days. 12 tablet 0   promethazine  (PHENERGAN ) 12.5 MG suppository Place 1 suppository (12.5 mg total) rectally every 6 (six) hours as needed for nausea or vomiting. 12 each 0   No current facility-administered medications on file prior to visit.    Review of Systems: ROS negative except for as is noted on the assessment and plan.  Objective:   Vitals:   04/21/23 0853  BP: 136/79  Pulse: 83  Temp: 98.5 F (36.9 C)  TempSrc: Oral  SpO2: 100%  Weight: 204 lb 4.8 oz (92.7 kg)  Height: 5' 11 (1.803 m)   Physical Exam: Constitutional: well-appearing, in no acute distress HENT: normocephalic atraumatic, mucous membranes moist Cardiovascular: regular rate and rhythm  Assessment & Plan:   Opioid use disorder Patient here for three month follow up of OUD and Suboxone  refill. He reports feeling well on current dose of Suboxone  8-2 mg BID, denies relapses or cravings. He has been out for the last two days and is just starting to feel a little pain. Discussed decreasing his dose at next visit as suboxone  may be contributing to his chronic nausea. Patient would  not like to change his dose today but may be amenable at next visit.  - Refill Suboxone  8-2mg  60 tablets with two refills - Toxassure today - Virtual visit in three months    Patient seen with Dr. Karna Redell Burnet MD [facility] Internal [facility]  PGY-1 Pager: [phone] Date 04/21/2023  Time 9:46 AM

## 2023-04-21 NOTE — Assessment & Plan Note (Signed)
 Patient here for three month follow up of OUD and Suboxone  refill. He reports feeling well on current dose of Suboxone  8-2 mg BID, denies relapses or cravings. He has been out for the last two days and is just starting to feel a little pain. Discussed decreasing his dose at next visit as suboxone  may be contributing to his chronic nausea. Patient would not like to change his dose today but may be amenable at next visit.  - Refill Suboxone  8-2mg  60 tablets with two refills - Toxassure today - Virtual visit in three months

## 2023-04-21 NOTE — Patient Instructions (Signed)
 Edward [name],   It was a pleasure meeting you today,   I am sending in a 30 day supply of your Suboxone  with two refills. I am also refilling your nausea medications. I suspect your Suboxone  may be contributing to your nausea, so let's discuss decreasing your dosage at the next visit.   Thanks,  Dr Francella

## 2023-04-21 NOTE — Addendum Note (Signed)
 Addended by: Monna Fam on: 04/21/2023 09:57 AM   Modules accepted: Orders

## 2023-04-25 DIAGNOSIS — R059 Cough, unspecified: Secondary | ICD-10-CM | POA: Diagnosis not present

## 2023-04-25 DIAGNOSIS — R6883 Chills (without fever): Secondary | ICD-10-CM | POA: Diagnosis not present

## 2023-04-25 DIAGNOSIS — R112 Nausea with vomiting, unspecified: Secondary | ICD-10-CM | POA: Diagnosis not present

## 2023-04-25 DIAGNOSIS — R058 Other specified cough: Secondary | ICD-10-CM | POA: Diagnosis not present

## 2023-04-25 DIAGNOSIS — R0981 Nasal congestion: Secondary | ICD-10-CM | POA: Diagnosis not present

## 2023-04-25 DIAGNOSIS — Z20822 Contact with and (suspected) exposure to covid-19: Secondary | ICD-10-CM | POA: Diagnosis not present

## 2023-04-25 DIAGNOSIS — J209 Acute bronchitis, unspecified: Secondary | ICD-10-CM | POA: Diagnosis not present

## 2023-04-25 DIAGNOSIS — J0140 Acute pansinusitis, unspecified: Secondary | ICD-10-CM | POA: Diagnosis not present

## 2023-04-25 DIAGNOSIS — R519 Headache, unspecified: Secondary | ICD-10-CM | POA: Diagnosis not present

## 2023-04-25 LAB — TOXASSURE SELECT,+ANTIDEPR,UR

## 2023-05-01 NOTE — Progress Notes (Signed)
 Internal Medicine Clinic Attending  Case discussed with Dr.  Francella   At the time of the visit.  We reviewed the resident's history and exam and pertinent patient test results.  I agree with the assessment, diagnosis, and plan of care documented in the resident's note.   ToxAssure with unexpected methamphetamine. Dr. Francella spoke with the patient who wished to discuss in person. Patient will need an appointment in the next two weeks, sent to scheduling.

## 2023-05-16 ENCOUNTER — Encounter: Payer: Self-pay | Admitting: Student

## 2023-05-16 NOTE — Progress Notes (Deleted)
CC: ***  HPI:  [name] is a 53 y.o. male with past medical history of hypertension, GERD, anxiety and depression who presents for follow-up appointment.  Please see assessment and plan for full HPI.  Medications: Hypertension: Amlodipine 5 mg daily OUD Suboxone 8-2 mg 1 tablet twice daily Anxiety/depression: Duloxetine 60 mg daily Neuropathy: Gabapentin 300 mg twice daily Cannabinoid hyperemesis syndrome: Reglan 5 mg every 6 hours as needed, Zofran 4 mg every 8 hours as needed, Phenergan 12.5 mg every 6 hours as needed GERD: Protonix 40 mg twice daily Hypokalemia: Potassium 40 mg twice daily Migraines: Sumatriptan 20 mg nasal spray every 2 hours as needed  Patient has recently been seen in the clinic on 04/21/2023 for OUD visit. Patient had tox assure showing methamphetamines THC.  Called back to discuss tox assure.  Past Medical History:  Diagnosis Date   Bronchitis    GERD (gastroesophageal reflux disease)      Current Outpatient Medications:    acetaminophen (TYLENOL) 500 MG tablet, Take 1,000-1,500 mg by mouth daily as needed for headache or moderate pain., Disp: , Rfl:    amLODipine (NORVASC) 5 MG tablet, Take 1 tablet (5 mg total) by mouth daily., Disp: 30 tablet, Rfl: 11   buprenorphine-naloxone (SUBOXONE) 8-2 mg SUBL SL tablet, Place 1 tablet under the tongue in the morning and at bedtime., Disp: 60 tablet, Rfl: 2   DULoxetine (CYMBALTA) 30 MG capsule, Take 2 capsules (60 mg total) by mouth daily., Disp: 180 capsule, Rfl: 4   gabapentin (NEURONTIN) 300 MG capsule, Take 1 capsule (300 mg total) by mouth 2 (two) times daily., Disp: 90 capsule, Rfl: 5   ibuprofen (ADVIL) 200 MG tablet, Take 400-600 mg by mouth daily as needed for moderate pain or headache., Disp: , Rfl:    metoCLOPramide (REGLAN) 5 MG tablet, Take 1 tablet (5 mg total) by mouth every 6 (six) hours as needed. for nausea, Disp: 120 tablet, Rfl: 0   ondansetron (ZOFRAN) 4 MG tablet, TAKE 1 TABLET BY  MOUTH EVERY 8 HOURS AS NEEDED, Disp: 60 tablet, Rfl: 1   pantoprazole (PROTONIX) 40 MG tablet, Take 1 tablet (40 mg total) by mouth 2 (two) times daily before a meal., Disp: 60 tablet, Rfl: 11   potassium chloride SA (KLOR-CON M) 20 MEQ tablet, Take 2 tablets (40 mEq total) by mouth 2 (two) times daily for 3 days., Disp: 12 tablet, Rfl: 0   promethazine (PHENERGAN) 12.5 MG suppository, Place 1 suppository (12.5 mg total) rectally every 6 (six) hours as needed for nausea or vomiting., Disp: 12 each, Rfl: 0   promethazine (PHENERGAN) 12.5 MG tablet, Take 1 tablet (12.5 mg total) by mouth every 6 (six) hours as needed for nausea or vomiting., Disp: 30 tablet, Rfl: 2   SUMAtriptan (IMITREX) 20 MG/ACT nasal spray, Place 1 spray (20 mg total) into the nose every 2 (two) hours as needed for migraine or headache. To be administered during the prodrome or shortly after vomiting begins (eg, within 30 to 45 minutes).May repeat in 2 hours if vomiting persists or recurs. No More than 6 doses / week., Disp: 6 each, Rfl: 2  Review of Systems:  ***  Constitutional: Eye: Respiratory: Cardiovascular: GI: MSK: GU: Skin: Neuro: Endocrine:   Physical Exam:  There were no vitals filed for this visit. *** General: Patient is sitting comfortably in the room  Eyes: Pupils equal and reactive to light, EOM intact  Head: Normocephalic, atraumatic  Neck: Supple, nontender, full range of  motion, No JVD Cardio: Regular rate and rhythm, no murmurs, rubs or gallops. 2+ pulses to bilateral upper and lower extremities  Chest: No chest tenderness Pulmonary: Clear to ausculation bilaterally with no rales, rhonchi, and crackles  Abdomen: Soft, nontender with normoactive bowel sounds with no rebound or guarding  Neuro: Alert and orientated x3. CN II-XII intact. Sensation intact to upper and lower extremities. 2+ patellar reflex.  Back: No midline tenderness, no step off or deformities noted. No paraspinal muscle  tenderness.  Skin: No rashes noted  MSK: 5/5 strength to upper and lower extremities.    Assessment & Plan:   No problem-specific Assessment & Plan notes found for this encounter.    Patient {GC/GE:3044014::"discussed with","seen with"} Dr. {NAMES:[id number]::"Guilloud","Hoffman","Mullen","Narendra","Williams","Vincent"}  Modena Slater, DO PGY-2 Internal Medicine Resident  Pager: [phone]

## 2023-05-31 ENCOUNTER — Telehealth: Payer: Self-pay | Admitting: Internal Medicine

## 2023-05-31 DIAGNOSIS — F1190 Opioid use, unspecified, uncomplicated: Secondary | ICD-10-CM

## 2023-05-31 NOTE — Telephone Encounter (Signed)
Prescription Request  05/31/2023  LOV: 04/21/2023  What is the name of the medication or equipment? buprenorphine-naloxone (SUBOXONE) 8-2 mg SUBL SL tablet, gabapentin (NEURONTIN) 300 MG capsule   Have you contacted your pharmacy to request a refill? No   Which pharmacy would you like this sent to?  [id number] [place], [place] - [address] [place] [address] Phone: [phone] Fax: [phone]    Patient notified that their request is being sent to the clinical staff for review and that they should receive a response within 2 business days.   Please advise at [facility] [phone]

## 2023-06-02 ENCOUNTER — Other Ambulatory Visit: Payer: Self-pay | Admitting: Internal Medicine

## 2023-06-23 ENCOUNTER — Other Ambulatory Visit: Payer: Self-pay | Admitting: Internal Medicine

## 2023-06-23 DIAGNOSIS — F32A Depression, unspecified: Secondary | ICD-10-CM

## 2023-06-28 ENCOUNTER — Emergency Department (HOSPITAL_COMMUNITY)
Admission: EM | Admit: 2023-06-28 | Discharge: 2023-06-28 | Disposition: A | Discharge status: Home / Self Care | Payer: Self-pay | Attending: Student | Admitting: Student

## 2023-06-28 ENCOUNTER — Other Ambulatory Visit: Payer: Self-pay

## 2023-06-28 ENCOUNTER — Encounter (HOSPITAL_COMMUNITY): Payer: Self-pay

## 2023-06-28 ENCOUNTER — Emergency Department (HOSPITAL_COMMUNITY): Payer: Self-pay

## 2023-06-28 DIAGNOSIS — M545 Low back pain, unspecified: Secondary | ICD-10-CM | POA: Insufficient documentation

## 2023-06-28 DIAGNOSIS — R079 Chest pain, unspecified: Secondary | ICD-10-CM | POA: Insufficient documentation

## 2023-06-28 DIAGNOSIS — R000 Tachycardia, unspecified: Secondary | ICD-10-CM | POA: Insufficient documentation

## 2023-06-28 DIAGNOSIS — R109 Unspecified abdominal pain: Secondary | ICD-10-CM | POA: Insufficient documentation

## 2023-06-28 LAB — CBC WITH DIFFERENTIAL/PLATELET
Abs Immature Granulocytes: 0.01 10*3/uL (ref 0.00–0.07)
Basophils Absolute: 0 10*3/uL (ref 0.0–0.1)
Basophils Relative: 1 %
Eosinophils Absolute: 0.5 10*3/uL (ref 0.0–0.5)
Eosinophils Relative: 9 %
HCT: 39.2 % (ref 39.0–52.0)
Hemoglobin: 13.1 g/dL (ref 13.0–17.0)
Immature Granulocytes: 0 %
Lymphocytes Relative: 45 %
Lymphs Abs: 2.4 10*3/uL (ref 0.7–4.0)
MCH: 27.8 pg (ref 26.0–34.0)
MCHC: 33.4 g/dL (ref 30.0–36.0)
MCV: 83.2 fL (ref 80.0–100.0)
Monocytes Absolute: 0.5 10*3/uL (ref 0.1–1.0)
Monocytes Relative: 10 %
Neutro Abs: 1.9 10*3/uL (ref 1.7–7.7)
Neutrophils Relative %: 35 %
Platelets: 224 10*3/uL (ref 150–400)
RBC: 4.71 MIL/uL (ref 4.22–5.81)
RDW: 13.1 % (ref 11.5–15.5)
WBC: 5.3 10*3/uL (ref 4.0–10.5)
nRBC: 0 % (ref 0.0–0.2)

## 2023-06-28 LAB — RESP PANEL BY RT-PCR (RSV, FLU A&B, COVID)  RVPGX2
Influenza A by PCR: NEGATIVE
Influenza B by PCR: NEGATIVE
Resp Syncytial Virus by PCR: NEGATIVE
SARS Coronavirus 2 by RT PCR: NEGATIVE

## 2023-06-28 LAB — I-STAT CHEM 8, ED
BUN: 22 mg/dL — ABNORMAL HIGH (ref 6–20)
Calcium, Ion: 1.21 mmol/L (ref 1.15–1.40)
Chloride: 105 mmol/L (ref 98–111)
Creatinine, Ser: 1.1 mg/dL (ref 0.61–1.24)
Glucose, Bld: 94 mg/dL (ref 70–99)
HCT: 40 % (ref 39.0–52.0)
Hemoglobin: 13.6 g/dL (ref 13.0–17.0)
Potassium: 4.3 mmol/L (ref 3.5–5.1)
Sodium: 140 mmol/L (ref 135–145)
TCO2: 26 mmol/L (ref 22–32)

## 2023-06-28 LAB — COMPREHENSIVE METABOLIC PANEL
ALT: 18 U/L (ref 0–44)
AST: 18 U/L (ref 15–41)
Albumin: 4.1 g/dL (ref 3.5–5.0)
Alkaline Phosphatase: 87 U/L (ref 38–126)
Anion gap: 10 (ref 5–15)
BUN: 24 mg/dL — ABNORMAL HIGH (ref 6–20)
CO2: 25 mmol/L (ref 22–32)
Calcium: 9.7 mg/dL (ref 8.9–10.3)
Chloride: 102 mmol/L (ref 98–111)
Creatinine, Ser: 0.93 mg/dL (ref 0.61–1.24)
GFR, Estimated: >60 mL/min (ref >60)
Glucose, Bld: 91 mg/dL (ref 70–99)
Potassium: 4.2 mmol/L (ref 3.5–5.1)
Sodium: 137 mmol/L (ref 135–145)
Total Bilirubin: 0.9 mg/dL (ref 0.0–1.2)
Total Protein: 7.4 g/dL (ref 6.5–8.1)

## 2023-06-28 LAB — TROPONIN I (HIGH SENSITIVITY)
Troponin I (High Sensitivity): 4 ng/L (ref <18)
Troponin I (High Sensitivity): 5 ng/L (ref <18)

## 2023-06-28 LAB — LIPASE, BLOOD: Lipase: 26 U/L (ref 11–51)

## 2023-06-28 MED ORDER — IOHEXOL 350 MG/ML SOLN
100.0000 mL | Freq: Once | INTRAVENOUS | Status: AC | PRN
  Administered 2023-06-28: 100 mL via INTRAVENOUS

## 2023-06-28 MED ORDER — MORPHINE SULFATE (PF) 4 MG/ML IV SOLN
4.0000 mg | Freq: Once | INTRAVENOUS | Status: AC
  Administered 2023-06-28: 4 mg via INTRAVENOUS
  Filled 2023-06-28: qty 1

## 2023-06-28 MED ORDER — ONDANSETRON HCL 4 MG/2ML IJ SOLN
4.0000 mg | Freq: Once | INTRAMUSCULAR | Status: AC
  Administered 2023-06-28: 4 mg via INTRAVENOUS
  Filled 2023-06-28: qty 2

## 2023-06-28 MED ORDER — LIDOCAINE 5 % EX PTCH: 1.0000 | MEDICATED_PATCH | CUTANEOUS | 0 refills | Status: AC

## 2023-06-28 MED ORDER — KETOROLAC TROMETHAMINE 15 MG/ML IJ SOLN
15.0000 mg | Freq: Once | INTRAMUSCULAR | Status: AC
Start: 2023-06-28 — End: 2023-06-28
  Administered 2023-06-28: 15 mg via INTRAMUSCULAR
  Filled 2023-06-28: qty 1

## 2023-06-28 MED ORDER — FENTANYL CITRATE PF 50 MCG/ML IJ SOSY
50.0000 ug | PREFILLED_SYRINGE | Freq: Once | INTRAMUSCULAR | Status: AC
  Administered 2023-06-28: 50 ug via INTRAVENOUS
  Filled 2023-06-28: qty 1

## 2023-06-28 MED ORDER — NAPROXEN 375 MG PO TABS: 375.0000 mg | ORAL_TABLET | Freq: Two times a day (BID) | ORAL | 0 refills | Status: DC

## 2023-06-28 MED ORDER — LIDOCAINE 5 % EX PTCH
2.0000 | MEDICATED_PATCH | CUTANEOUS | Status: DC
  Administered 2023-06-28: 2 via TRANSDERMAL
  Filled 2023-06-28: qty 2

## 2023-06-28 NOTE — ED Triage Notes (Signed)
 Pt c/o mid back pain down to lower back. Pt states symptoms have been going on for a week. Pt now states join pain and knee pain.

## 2023-06-28 NOTE — ED Provider Notes (Signed)
  EMERGENCY DEPARTMENT AT [facility] Provider Note  CSN: [id number] Arrival date & time: 06/28/23 1121  Chief Complaint(s) Chest Pain  HPI Edward [name] is a 53 y.o. male with PMH GERD, cannabinoid hyperemesis, opioid use disorder, chronic back pain who presents emergency room for evaluation of multiple complaints including chest pain, abdominal pain, back pain and nausea.  States that pain has been progressively worsening over the last 1 week but has significantly worsened over the last 24 hours.  Patient arrives in extremis with diaphoresis and is having a very difficult time finding a comfortable position in the bed.  Endorses associated shortness of breath.  States that pain is primarily in the epigastrium radiating to the back.   Past Medical History Past Medical History:  Diagnosis Date   Bronchitis    GERD (gastroesophageal reflux disease)    Patient Active Problem List   Diagnosis Date Noted   RUQ pain 11/15/2022   Generalized abdominal pain 11/15/2022   Diarrhea 06/22/2022   Loss of weight 06/22/2022   Hx of colonic polyps 03/26/2022   N&V (nausea and vomiting) 03/07/2022   Colon cancer screening 03/07/2022   Lipoma 02/18/2022   Hypertension 02/18/2022   Radicular pain of left lower extremity 01/20/2022   Healthcare maintenance 01/20/2022   Cannabinoid hyperemesis syndrome 10/06/2021   Abdominal pain, chronic, epigastric 10/06/2021   GERD (gastroesophageal reflux disease) 10/06/2021   Avascular necrosis of femoral head (HCC) 06/15/2021   Opioid use disorder 05/19/2021   Chronic lower back pain 05/19/2021   Anxiety and depression 05/19/2021   Home Medication(s) Prior to Admission medications   Medication Sig Start Date End Date Taking? Authorizing Provider  acetaminophen (TYLENOL) 500 MG tablet Take 1,000-1,500 mg by mouth daily as needed for headache or moderate pain.   Yes [provider]  amLODipine (NORVASC) 5 MG tablet Take 1  tablet (5 mg total) by mouth daily. 04/21/23 04/20/24 Yes Monna Fam, MD  buprenorphine-naloxone (SUBOXONE) 8-2 mg SUBL SL tablet Place 1 tablet under the tongue in the morning and at bedtime. 04/21/23  Yes Monna Fam, MD  DULoxetine (CYMBALTA) 30 MG capsule Take 2 capsules (60 mg total) by mouth daily. 04/21/23  Yes Monna Fam, MD  gabapentin (NEURONTIN) 300 MG capsule Take 1 capsule (300 mg total) by mouth 2 (two) times daily. 04/21/23  Yes Monna Fam, MD  ibuprofen (ADVIL) 200 MG tablet Take 400-600 mg by mouth daily as needed for moderate pain or headache.   Yes [provider]  lidocaine (LIDODERM) 5 % Place 1 patch onto the skin daily. Remove & Discard patch within 12 hours or as directed by MD 06/28/23  Yes Jaccob Czaplicki, MD  metoCLOPramide (REGLAN) 5 MG tablet Take 1 tablet (5 mg total) by mouth every 6 (six) hours as needed. for nausea 04/21/23  Yes Monna Fam, MD  naproxen (NAPROSYN) 375 MG tablet Take 1 tablet (375 mg total) by mouth 2 (two) times daily. 06/28/23  Yes Nikoleta Dady, MD  ondansetron (ZOFRAN) 4 MG tablet TAKE 1 TABLET BY MOUTH EVERY 8 HOURS AS NEEDED 09/06/22  Yes Gelene Mink, NP  pantoprazole (PROTONIX) 40 MG tablet Take 1 tablet (40 mg total) by mouth 2 (two) times daily before a meal. 04/21/23 04/20/24 Yes Monna Fam, MD  promethazine (PHENERGAN) 12.5 MG suppository Place 1 suppository (12.5 mg total) rectally every 6 (six) hours as needed for nausea or vomiting. 06/24/22  Yes Letta Median, PA-C  promethazine (PHENERGAN) 12.5 MG tablet Take  1 tablet (12.5 mg total) by mouth every 6 (six) hours as needed for nausea or vomiting. 04/21/23  Yes Monna Fam, MD  SUMAtriptan [facility]) 20 MG/ACT nasal spray Place 1 spray (20 mg total) into the nose every 2 (two) hours as needed for migraine or headache. To be administered during the prodrome or shortly after vomiting begins (eg, within 30 to 45 minutes).May repeat in 2 hours if vomiting persists or recurs. No More than 6  doses / week. 04/21/23  Yes Monna Fam, MD                                                                                                                                    Past Surgical History Past Surgical History:  Procedure Laterality Date   BIOPSY  09/20/2021   Procedure: BIOPSY;  Surgeon: Lanelle Bal, DO;  [facility]: [facility];  Service: Endoscopy;;   COLONOSCOPY WITH PROPOFOL N/A 03/21/2022   Procedure: COLONOSCOPY WITH PROPOFOL;  Surgeon: Lanelle Bal, DO;  Location: [facility];  Service: Endoscopy;  Laterality: N/A;  2:00 pm   ESOPHAGOGASTRODUODENOSCOPY (EGD) WITH PROPOFOL N/A 09/20/2021   Procedure: ESOPHAGOGASTRODUODENOSCOPY (EGD) WITH PROPOFOL;  Surgeon: Lanelle Bal, DO;  Location: [facility];  Service: Endoscopy;  Laterality: N/A;  12:15pm   POLYPECTOMY  03/21/2022   Procedure: POLYPECTOMY;  Surgeon: Lanelle Bal, DO;  [facility]: [facility];  Service: Endoscopy;;   Family History Family History  Problem Relation Age of Onset   Diabetes Mother    Heart disease Father    Prostate cancer Father     Social History Social History   Tobacco Use   Smoking status: Never    Passive exposure: Current   Smokeless tobacco: Never  Vaping Use   Vaping status: Never Used  Substance Use Topics   Alcohol use: Not Currently   Drug use: Yes    Types: Marijuana   Allergies Patient has no known allergies.  Review of Systems Review of Systems  Constitutional:  Positive for diaphoresis.  Respiratory:  Positive for shortness of breath.   Cardiovascular:  Positive for chest pain.  Gastrointestinal:  Positive for abdominal pain.  Musculoskeletal:  Positive for back pain.    Physical Exam Vital Signs  I have reviewed the triage vital signs BP 103/69   Pulse 73   Temp 98.3 F (36.8 C) (Oral)   Resp 18   Ht 5\' 11"  (1.803 m)   Wt 92.7 kg   SpO2 93%   BMI 28.50 kg/m   Physical Exam Constitutional:      General: He is not in acute  distress.    Appearance: Normal appearance. He is ill-appearing and diaphoretic.  HENT:     Head: Normocephalic and atraumatic.     Nose: No congestion or rhinorrhea.  Eyes:     General:        Right eye: No discharge.  Left eye: No discharge.     Extraocular Movements: Extraocular movements intact.     Pupils: Pupils are equal, round, and reactive to light.  Cardiovascular:     Rate and Rhythm: Regular rhythm. Tachycardia present.     Heart sounds: No murmur heard. Pulmonary:     Effort: No respiratory distress.     Breath sounds: No wheezing or rales.  Abdominal:     General: There is no distension.     Tenderness: There is abdominal tenderness.  Musculoskeletal:        General: Tenderness present. Normal range of motion.     Cervical back: Normal range of motion.  Skin:    General: Skin is warm.  Neurological:     General: No focal deficit present.     Mental Status: He is alert.     ED Results and Treatments Labs (all labs ordered are listed, but only abnormal results are displayed) Labs Reviewed  COMPREHENSIVE METABOLIC PANEL - Abnormal; Notable for the following components:      Result Value   BUN 24 (*)    All other components within normal limits  I-STAT CHEM 8, ED - Abnormal; Notable for the following components:   BUN 22 (*)    All other components within normal limits  RESP PANEL BY RT-PCR (RSV, FLU A&B, COVID)  RVPGX2  CBC WITH DIFFERENTIAL/PLATELET  LIPASE, BLOOD  TROPONIN I (HIGH SENSITIVITY)  TROPONIN I (HIGH SENSITIVITY)                                                                                                                          Radiology CT Angio Chest/Abd/Pel for Dissection W and/or Wo Contrast Result Date: 06/28/2023 CLINICAL DATA:  Acute aortic syndrome (AAS) suspected. Severe back pain radiating into chest and shoulder. EXAM: CT ANGIOGRAPHY CHEST, ABDOMEN AND PELVIS TECHNIQUE: Non-contrast CT of the chest was initially obtained.  Multidetector CT imaging through the chest, abdomen and pelvis was performed using the standard protocol during bolus administration of intravenous contrast. Multiplanar reconstructed images and MIPs were obtained and reviewed to evaluate the vascular anatomy. RADIATION DOSE REDUCTION: This exam was performed according to the departmental dose-optimization program which includes automated exposure control, adjustment of the mA and/or kV according to patient size and/or use of iterative reconstruction technique. CONTRAST:  OMNIPAQUE IOHEXOL 350 MG/ML SOLN COMPARISON:  CT scan abdomen and pelvis from 11/28/2022. FINDINGS: CTA CHEST FINDINGS Cardiovascular: No intramural hematoma noted in the thoracic aorta on the unenhanced images. Thoracic aorta is normal in caliber without aneurysm, dissection, vasculitis or significant stenosis. Normal cardiac size. No pericardial effusion. No aortic aneurysm. There are coronary artery calcifications, in keeping with coronary artery disease. Mediastinum/Nodes: Visualized thyroid gland appears grossly unremarkable. No solid / cystic mediastinal masses. The esophagus is nondistended precluding optimal assessment. No axillary, mediastinal or hilar lymphadenopathy by size criteria. Lungs/Pleura: The central tracheo-bronchial tree is patent. No mass or consolidation. No pleural effusion or pneumothorax. No suspicious  lung nodules. Musculoskeletal: The visualized soft tissues of the chest wall are grossly unremarkable. No suspicious osseous lesions. Review of the MIP images confirms the above findings. CTA ABDOMEN AND PELVIS FINDINGS VASCULAR Aorta: Normal caliber aorta without aneurysm, dissection, vasculitis or significant stenosis. Celiac: Patent without evidence of aneurysm, dissection, vasculitis or significant stenosis. SMA: Patent without evidence of aneurysm, dissection, vasculitis or significant stenosis. Renals: Both renal arteries are patent without evidence of aneurysm,  dissection, vasculitis, fibromuscular dysplasia or significant stenosis. IMA: Patent without evidence of aneurysm, dissection, vasculitis or significant stenosis. Inflow: Patent without evidence of aneurysm, dissection, vasculitis or significant stenosis. Veins: No obvious venous abnormality within the limitations of this arterial phase study. Review of the MIP images confirms the above findings. NON-VASCULAR Hepatobiliary: The liver is normal in size. Non-cirrhotic configuration. No suspicious mass. No intrahepatic or extrahepatic bile duct dilation. No calcified gallstones. Normal gallbladder wall thickness. No pericholecystic inflammatory changes. Pancreas: Unremarkable. No pancreatic ductal dilatation or surrounding inflammatory changes. Spleen: Within normal limits. No focal lesion. Adrenals/Urinary Tract: Adrenal glands are unremarkable. No suspicious renal mass. No hydroureteronephrosis on either side. There are 2, 1-2 mm nonobstructing calculi in bilateral kidneys. No ureterolithiasis on either side. Unremarkable urinary bladder. Stomach/Bowel: No disproportionate dilation of the small or large bowel loops. No evidence of abnormal bowel wall thickening or inflammatory changes. The appendix is unremarkable. Vascular/Lymphatic: No ascites or pneumoperitoneum. No abdominal or pelvic lymphadenopathy, by size criteria. No aneurysmal dilation of the major abdominal arteries. There are mild peripheral atherosclerotic vascular calcifications of the aorta and its major branches. Reproductive: Normal size prostate. Symmetric seminal vesicles. Other: There is a tiny fat containing umbilical hernia. The soft tissues and abdominal wall are otherwise unremarkable. Musculoskeletal: No suspicious osseous lesions. There are mild multilevel degenerative changes in the visualized spine. Review of the MIP images confirms the above findings. IMPRESSION: 1. No acute aortic syndrome. No acute thoracic aortic intramural hematoma.  No thoracoabdominal aortic aneurysm, dissection or penetrating atherosclerotic ulcer. 2. No acute inflammatory process identified within the chest, abdomen or pelvis. 3. There are two, 1-2 mm nonobstructing calculi in bilateral kidneys. No ureterolithiasis or obstructive uropathy on either side. 4. Multiple other nonacute observations, as described above. Aortic Atherosclerosis (ICD10-I70.0). Electronically Signed   By: Jules Schick M.D.   On: 06/28/2023 13:43    Pertinent labs & imaging results that were available during my care of the patient were reviewed by me and considered in my medical decision making (see MDM for details).  Medications Ordered in ED Medications  ondansetron (ZOFRAN) injection 4 mg (4 mg Intravenous Given 06/28/23 1135)  fentaNYL (SUBLIMAZE) injection 50 mcg (50 mcg Intravenous Given 06/28/23 1136)  iohexol (OMNIPAQUE) 350 MG/ML injection 100 mL (100 mLs Intravenous Contrast Given 06/28/23 1142)  morphine (PF) 4 MG/ML injection 4 mg (4 mg Intravenous Given 06/28/23 1207)  ketorolac (TORADOL) 15 MG/ML injection 15 mg (15 mg Intramuscular Given 06/28/23 1314)  Procedures Procedures  (including critical care time)  Medical Decision Making / ED Course   This patient presents to the ED for concern of chest pain, back pain, abdominal pain, this involves an extensive number of treatment options, and is a complaint that carries with it a high risk of complications and morbidity.  The differential diagnosis includes ACS, Aortic Dissection, Pneumothorax, Pneumonia, Esophageal Rupture, PE, Tamponade/Pericardial Effusion, pericarditis, esophageal spasm, dysrhythmia, GERD, costochondritis.  MDM: Patient seen emergency room for evaluation of multiple complaints including chest pain, abdominal pain and back pain.  Physical exam reveals an ill-appearing  diaphoretic patient squirming in bed with generalized tenderness of the abdomen.  ECG obtained immediately with no evidence of ischemia.  Follow-up laboratory evaluation is also reassuring.  Patient taken for dissection study given chest pain radiating to the back that was reassuringly negative for any acute intra thoracic or abdominal pathology.  Patient given multimodal pain control and ultimately did have significant improvement of his symptoms.  On reevaluation he states his symptoms have resolved and his diaphoresis has resolved.  Both high-sensitivity troponin and delta troponin is negative.  Given previous history of cannabinoid hyperemesis, cyclic vomiting/pain syndrome remains on the differential in the setting of an overall negative workup.  Heart score is low to moderate and have overall lower suspicion for ACS especially in the setting of negative ECG and negative high-sensitivity troponin and delta troponin.  As symptoms have resolved he currently does not meet inpatient criteria for admission and will be discharged with strict return precautions and outpatient follow-up.   Additional history obtained: -Additional history obtained from wife -External records from outside source obtained and reviewed including: Chart review including previous notes, labs, imaging, consultation notes   Lab Tests: -I ordered, reviewed, and interpreted labs.   The pertinent results include:   Labs Reviewed  COMPREHENSIVE METABOLIC PANEL - Abnormal; Notable for the following components:      Result Value   BUN 24 (*)    All other components within normal limits  I-STAT CHEM 8, ED - Abnormal; Notable for the following components:   BUN 22 (*)    All other components within normal limits  RESP PANEL BY RT-PCR (RSV, FLU A&B, COVID)  RVPGX2  CBC WITH DIFFERENTIAL/PLATELET  LIPASE, BLOOD  TROPONIN I (HIGH SENSITIVITY)  TROPONIN I (HIGH SENSITIVITY)      EKG   EKG Interpretation Date/Time:  Wednesday  June 28 2023 11:28:32 EDT Ventricular Rate:  95 PR Interval:  126 QRS Duration:  93 QT Interval:  342 QTC Calculation: 430 R Axis:   78  Text Interpretation: Sinus rhythm Confirmed by Xavion Muscat ([id number]) on 06/28/2023 11:30:58 AM         Imaging Studies ordered: I ordered imaging studies including CT dissection study I independently visualized and interpreted imaging. I agree with the radiologist interpretation   Medicines ordered and prescription drug management: Meds ordered this encounter  Medications   ondansetron (ZOFRAN) injection 4 mg   fentaNYL (SUBLIMAZE) injection 50 mcg   iohexol (OMNIPAQUE) 350 MG/ML injection 100 mL   morphine (PF) 4 MG/ML injection 4 mg    Refill:  0   ketorolac (TORADOL) 15 MG/ML injection 15 mg   DISCONTD: lidocaine (LIDODERM) 5 % 2 patch   lidocaine (LIDODERM) 5 %    Sig: Place 1 patch onto the skin daily. Remove & Discard patch within 12 hours or as directed by MD    Dispense:  30 patch    Refill:  0  naproxen (NAPROSYN) 375 MG tablet    Sig: Take 1 tablet (375 mg total) by mouth 2 (two) times daily.    Dispense:  20 tablet    Refill:  0    -I have reviewed the patients home medicines and have made adjustments as needed  Critical interventions none   Cardiac Monitoring: The patient was maintained on a cardiac monitor.  I personally viewed and interpreted the cardiac monitored which showed an underlying rhythm of: NSR  Social Determinants of Health:  Factors impacting patients care include: none   Reevaluation: After the interventions noted above, I reevaluated the patient and found that they have :improved  Co morbidities that complicate the patient evaluation  Past Medical History:  Diagnosis Date   Bronchitis    GERD (gastroesophageal reflux disease)       Dispostion: I considered admission for this patient, but at this time he does not meet inpatient criteria for admission and will be discharged with outpatient  follow-up and strict return precautions     Final Clinical Impression(s) / ED Diagnoses Final diagnoses:  Nonspecific chest pain  Abdominal pain, unspecified abdominal location  Acute midline low back pain, unspecified whether sciatica present     @PCDICTATION @    Glendora Score, MD 06/28/23 2016

## 2023-07-05 ENCOUNTER — Ambulatory Visit: Payer: Self-pay | Admitting: Orthopaedic Surgery

## 2023-07-10 ENCOUNTER — Other Ambulatory Visit: Payer: Self-pay | Admitting: Internal Medicine

## 2023-07-10 DIAGNOSIS — F1190 Opioid use, unspecified, uncomplicated: Secondary | ICD-10-CM

## 2023-07-10 DIAGNOSIS — M5410 Radiculopathy, site unspecified: Secondary | ICD-10-CM

## 2023-07-10 NOTE — Telephone Encounter (Signed)
 Copied from CRM [id number]. Topic: Clinical - Medication Refill >> Jul 10, 2023  3:28 PM Corin V wrote: Most Recent Primary Care Visit:  Provider: Monna Fam  Department: [facility]  Visit Type: OPEN ESTABLISHED  Date: 04/21/2023  Medication: gabapentin (NEURONTIN) 300 MG capsule buprenorphine-naloxone (SUBOXONE) 8-2 mg SUBL SL tablet  Has the patient contacted their pharmacy? Yes- per pharmacist no refills remaining (Agent: If no, request that the patient contact the pharmacy for the refill. If patient does not wish to contact the pharmacy document the reason why and proceed with request.) (Agent: If yes, when and what did the pharmacy advise?)  Is this the correct pharmacy for this prescription? Yes If no, delete pharmacy and type the correct one.  This is the patient's preferred pharmacy:  [id number] - [place], [place] - [address] [place] [address] Phone: [phone] Fax: [phone]   Has the prescription been filled recently? No  Is the patient out of the medication? Yes  Has the patient been seen for an appointment in the last year OR does the patient have an upcoming appointment? Yes  Can we respond through MyChart? No  Agent: Please be advised that Rx refills may take up to 3 business days. We ask that you follow-up with your pharmacy.

## 2023-09-04 ENCOUNTER — Other Ambulatory Visit: Payer: Self-pay

## 2023-09-04 ENCOUNTER — Ambulatory Visit: Payer: Self-pay

## 2023-09-04 ENCOUNTER — Emergency Department (HOSPITAL_COMMUNITY)

## 2023-09-04 ENCOUNTER — Emergency Department (HOSPITAL_COMMUNITY)
Admission: EM | Admit: 2023-09-04 | Discharge: 2023-09-04 | Disposition: A | Discharge status: Home / Self Care | Attending: Emergency Medicine | Admitting: Emergency Medicine

## 2023-09-04 ENCOUNTER — Encounter (HOSPITAL_COMMUNITY): Payer: Self-pay | Admitting: *Deleted

## 2023-09-04 DIAGNOSIS — Z79899 Other long term (current) drug therapy: Secondary | ICD-10-CM | POA: Insufficient documentation

## 2023-09-04 DIAGNOSIS — S2222XA Fracture of body of sternum, initial encounter for closed fracture: Secondary | ICD-10-CM | POA: Insufficient documentation

## 2023-09-04 DIAGNOSIS — I10 Essential (primary) hypertension: Secondary | ICD-10-CM | POA: Insufficient documentation

## 2023-09-04 DIAGNOSIS — S301XXA Contusion of abdominal wall, initial encounter: Secondary | ICD-10-CM | POA: Insufficient documentation

## 2023-09-04 DIAGNOSIS — S29001A Unspecified injury of muscle and tendon of front wall of thorax, initial encounter: Secondary | ICD-10-CM | POA: Diagnosis present

## 2023-09-04 DIAGNOSIS — Y92410 Unspecified street and highway as the place of occurrence of the external cause: Secondary | ICD-10-CM | POA: Diagnosis not present

## 2023-09-04 LAB — ETHANOL: Alcohol, Ethyl (B): <15 mg/dL (ref <15)

## 2023-09-04 LAB — CBC
HCT: 37.8 % — ABNORMAL LOW (ref 39.0–52.0)
Hemoglobin: 13 g/dL (ref 13.0–17.0)
MCH: 28.4 pg (ref 26.0–34.0)
MCHC: 34.4 g/dL (ref 30.0–36.0)
MCV: 82.7 fL (ref 80.0–100.0)
Platelets: 231 10*3/uL (ref 150–400)
RBC: 4.57 MIL/uL (ref 4.22–5.81)
RDW: 12.8 % (ref 11.5–15.5)
WBC: 7.5 10*3/uL (ref 4.0–10.5)
nRBC: 0 % (ref 0.0–0.2)

## 2023-09-04 LAB — I-STAT CHEM 8, ED
BUN: 12 mg/dL (ref 6–20)
Calcium, Ion: 1.16 mmol/L (ref 1.15–1.40)
Chloride: 98 mmol/L (ref 98–111)
Creatinine, Ser: 0.9 mg/dL (ref 0.61–1.24)
Glucose, Bld: 122 mg/dL — ABNORMAL HIGH (ref 70–99)
HCT: 34 % — ABNORMAL LOW (ref 39.0–52.0)
Hemoglobin: 11.6 g/dL — ABNORMAL LOW (ref 13.0–17.0)
Potassium: 3.6 mmol/L (ref 3.5–5.1)
Sodium: 137 mmol/L (ref 135–145)
TCO2: 26 mmol/L (ref 22–32)

## 2023-09-04 LAB — COMPREHENSIVE METABOLIC PANEL WITH GFR
ALT: 19 U/L (ref 0–44)
AST: 23 U/L (ref 15–41)
Albumin: 3.5 g/dL (ref 3.5–5.0)
Alkaline Phosphatase: 93 U/L (ref 38–126)
Anion gap: 7 (ref 5–15)
BUN: 13 mg/dL (ref 6–20)
CO2: 26 mmol/L (ref 22–32)
Calcium: 9 mg/dL (ref 8.9–10.3)
Chloride: 101 mmol/L (ref 98–111)
Creatinine, Ser: 0.85 mg/dL (ref 0.61–1.24)
GFR, Estimated: >60 mL/min (ref >60)
Glucose, Bld: 124 mg/dL — ABNORMAL HIGH (ref 70–99)
Potassium: 3.4 mmol/L — ABNORMAL LOW (ref 3.5–5.1)
Sodium: 134 mmol/L — ABNORMAL LOW (ref 135–145)
Total Bilirubin: 0.9 mg/dL (ref 0.0–1.2)
Total Protein: 6.8 g/dL (ref 6.5–8.1)

## 2023-09-04 LAB — LACTIC ACID, PLASMA: Lactic Acid, Venous: 0.9 mmol/L (ref 0.5–1.9)

## 2023-09-04 LAB — SAMPLE TO BLOOD BANK

## 2023-09-04 LAB — PROTIME-INR
INR: 1 (ref 0.8–1.2)
Prothrombin Time: 13.3 s (ref 11.4–15.2)

## 2023-09-04 MED ORDER — NAPROXEN 500 MG PO TABS: 500.0000 mg | ORAL_TABLET | Freq: Two times a day (BID) | ORAL | 0 refills | Status: DC

## 2023-09-04 MED ORDER — IOHEXOL 300 MG/ML  SOLN
100.0000 mL | Freq: Once | INTRAMUSCULAR | Status: AC | PRN
  Administered 2023-09-04: 100 mL via INTRAVENOUS

## 2023-09-04 MED ORDER — FENTANYL CITRATE PF 50 MCG/ML IJ SOSY
50.0000 ug | PREFILLED_SYRINGE | Freq: Once | INTRAMUSCULAR | Status: AC
  Administered 2023-09-04: 50 ug via INTRAVENOUS
  Filled 2023-09-04: qty 1

## 2023-09-04 MED ORDER — KETOROLAC TROMETHAMINE 30 MG/ML IJ SOLN
30.0000 mg | Freq: Once | INTRAMUSCULAR | Status: AC
  Administered 2023-09-04: 30 mg via INTRAVENOUS
  Filled 2023-09-04: qty 1

## 2023-09-04 MED ORDER — METHOCARBAMOL 500 MG PO TABS: 500.0000 mg | ORAL_TABLET | Freq: Two times a day (BID) | ORAL | 0 refills | Status: AC | PRN
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
## 2023-09-04 NOTE — Discharge Instructions (Addendum)
 Thankfully the CT scans did not show any signs of severe or serious internal injury.  It did show that you have a fracture of your breastbone.  This is generally not a surgical problem however I would strongly recommend that you follow-up with your family doctor as she will likely have some ongoing pain issues over the next couple of weeks.  I want you to stay out of work for at least a week just let the start this heal.  In the meantime take Naprosyn  twice a day and see the Robaxin  to help with muscle spasm.  Thank you for allowing us  to treat you in the emergency department today.  After reviewing your examination and potential testing that was done it appears that you are safe to go home.  I would like for you to follow-up with your doctor within the next several days, have them obtain your records and follow-up with them to review all potential tests and results from your visit.  If you should develop severe or worsening symptoms return to the emergency department immediately  [facility] Primary Care Doctor List    Alberteen Huge, MD. Specialty: [facility] Contact information: [address]  [place] [address]  [phone]   Charlotta Cook, MD. Specialty: [facility] Medicine Contact information: [address]  [place] [address]  [phone]   Clary Crown, MD Specialty: Internal Medicine Contact information: [address] [place] [address]  [phone]   Lucendia Rusk, MD. Specialty: Internal Medicine Contact information: [address]  [place] [address]  [phone]    [facility] (Dr. Burdette Carolin) Specialty: Family Medicine Contact information: [address]  [place] [address]  [phone]   Artemisa Bile, MD. Specialty: Internal Medicine Contact information: [address]  [address]  [place] [address]  [phone]   [facility]  [address] [place], [place]  [address] [phone]  Services [facility] offers a variety of basic health services.  People needing more complex services will be directed to a physician online. Using these virtual visits, doctors can evaluate and prescribe medicine and treatments. There will be no medication on-site, though [facility] will help patients fill their prescriptions at little to no cost.   [facility] PR[facility] [address] [place]  [address]  [phone]     [facility] [address] [place]  [address]  [phone]   For More information please go to: [email or web address]

## 2023-09-04 NOTE — ED Notes (Signed)
 Pt informed urinal sample needed. Urinal at bedside.
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
## 2023-09-05 ENCOUNTER — Ambulatory Visit: Payer: Self-pay | Admitting: Student

## 2023-09-12 ENCOUNTER — Other Ambulatory Visit: Payer: Self-pay | Admitting: Internal Medicine

## 2023-09-12 DIAGNOSIS — F1190 Opioid use, unspecified, uncomplicated: Secondary | ICD-10-CM

## 2023-09-18 ENCOUNTER — Encounter: Payer: Self-pay | Admitting: Internal Medicine

## 2023-09-18 ENCOUNTER — Ambulatory Visit (INDEPENDENT_AMBULATORY_CARE_PROVIDER_SITE_OTHER): Payer: Self-pay | Admitting: Internal Medicine

## 2023-09-18 VITALS — BP 139/84 | HR 100 | Temp 98.1°F | Ht 69.0 in | Wt 211.4 lb

## 2023-09-18 DIAGNOSIS — F1190 Opioid use, unspecified, uncomplicated: Secondary | ICD-10-CM

## 2023-09-18 DIAGNOSIS — F32A Depression, unspecified: Secondary | ICD-10-CM

## 2023-09-18 DIAGNOSIS — F419 Anxiety disorder, unspecified: Secondary | ICD-10-CM

## 2023-09-18 MED ORDER — GABAPENTIN 300 MG PO CAPS: 300.0000 mg | ORAL_CAPSULE | Freq: Two times a day (BID) | ORAL | 5 refills | Status: DC

## 2023-09-18 MED ORDER — ESCITALOPRAM OXALATE 10 MG PO TABS: 10.0000 mg | ORAL_TABLET | Freq: Every day | ORAL | 1 refills | Status: DC

## 2023-09-18 MED ORDER — BUPRENORPHINE HCL-NALOXONE HCL 8-2 MG SL SUBL: 1.0000 | SUBLINGUAL_TABLET | Freq: Three times a day (TID) | SUBLINGUAL | 0 refills | Status: DC

## 2023-09-18 NOTE — Progress Notes (Signed)
 Internal Medicine Clinic Attending  Case discussed with the resident at the time of the visit.  We reviewed the resident's history and exam and pertinent patient test results.  I agree with the assessment, diagnosis, and plan of care documented in the resident's note.

## 2023-09-18 NOTE — Addendum Note (Signed)
 Addended by: Jayson Michael on: 09/18/2023 01:28 PM   Modules accepted: Orders
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
## 2023-09-18 NOTE — Assessment & Plan Note (Signed)
 Patient reports worsening feelings of depression, insomnia, and night terrors. These feelings have been a problem in the past, but have been worsening in the past several weeks since the death of his father. Patient would like to be started on a medication that may help his mood symptoms. He doesn't feel his Cymbalta  helps with his mood or provides significant relief of neuropathic pain. He denies previous manic episodes. PHQ9 score is 14 today. - Lexapro 10mg  daily started. Instructed to taper off Cymbalta  over 3 day period prior to initiating Lexapro - Virtual visit in 1 month to check on symptoms, refill Suboxone 

## 2023-09-18 NOTE — Patient Instructions (Addendum)
 Mr Lefever,   It was a pleasure seeing you today.   For your suboxone , I am increasing your dosage to three times daily. I hope this will help with your pain. I am also starting a medication called Lexapro, which I hope will help with your mood symptoms. I recommend decreasing your Cymbalta  dose before you start taking your Lexapro. We will plan a virtual visit in 1 month to see how this medication is going.   Thanks,  Dr Esaw Heckler

## 2023-09-18 NOTE — Addendum Note (Signed)
 Addended by: Jayson Michael on: 09/18/2023 01:35 PM   Modules accepted: Orders

## 2023-09-20 LAB — TOXASSURE SELECT,+ANTIDEPR,UR

## 2023-10-05 ENCOUNTER — Encounter: Payer: Self-pay | Admitting: Internal Medicine

## 2023-10-25 ENCOUNTER — Other Ambulatory Visit: Payer: Self-pay

## 2023-10-25 DIAGNOSIS — F1190 Opioid use, unspecified, uncomplicated: Secondary | ICD-10-CM

## 2023-10-30 ENCOUNTER — Ambulatory Visit: Payer: Self-pay
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
## 2023-10-30 NOTE — Telephone Encounter (Signed)
 Pt has an appt tomorrow 7/15 w/Dr Azadegan.

## 2023-10-31 ENCOUNTER — Other Ambulatory Visit: Payer: Self-pay | Admitting: Student

## 2023-10-31 ENCOUNTER — Other Ambulatory Visit: Payer: Self-pay

## 2023-10-31 ENCOUNTER — Ambulatory Visit: Payer: Self-pay

## 2023-10-31 ENCOUNTER — Telehealth: Payer: Self-pay | Admitting: *Deleted

## 2023-10-31 VITALS — BP 116/84 | HR 100 | Temp 98.3°F | Resp 24 | Ht 69.0 in | Wt 206.4 lb

## 2023-10-31 DIAGNOSIS — M5410 Radiculopathy, site unspecified: Secondary | ICD-10-CM

## 2023-10-31 DIAGNOSIS — M19041 Primary osteoarthritis, right hand: Secondary | ICD-10-CM | POA: Insufficient documentation

## 2023-10-31 DIAGNOSIS — R200 Anesthesia of skin: Secondary | ICD-10-CM

## 2023-10-31 DIAGNOSIS — F1190 Opioid use, unspecified, uncomplicated: Secondary | ICD-10-CM

## 2023-10-31 MED ORDER — GABAPENTIN 300 MG PO CAPS: 300.0000 mg | ORAL_CAPSULE | Freq: Four times a day (QID) | ORAL | 5 refills | Status: AC

## 2023-10-31 MED ORDER — BUPRENORPHINE HCL-NALOXONE HCL 8-2 MG SL SUBL: 1.0000 | SUBLINGUAL_TABLET | Freq: Three times a day (TID) | SUBLINGUAL | 0 refills | Status: DC
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
## 2023-10-31 NOTE — Patient Instructions (Addendum)
 Thank you, Edward [name]. Edward [name], for allowing us  to provide your care today. We discussed your bilateral hand numbness and pain. We have increased your dose of gabapentin  and encouraged you to use wrist braces. If your symptoms do not improve after one month, we will see you again to consider further imaging.  Thank you for coming in today. It was a pleasure caring for you. Please let us  know if you have any problems or concerns.  I have ordered the following labs for you:  Lab Orders  No laboratory test(s) ordered today     Tests ordered today:   Referrals ordered today:   Referral Orders  No referral(s) requested today     I have ordered the following medication/changed the following medications:   Stop the following medications: Medications Discontinued During This Encounter  Medication Reason   gabapentin  (NEURONTIN ) 300 MG capsule Reorder     Start the following medications: Meds ordered this encounter  Medications   gabapentin  (NEURONTIN ) 300 MG capsule    Sig: Take 1 capsule (300 mg total) by mouth 4 (four) times daily.    Dispense:  90 capsule    Refill:  5    IM Program     Follow up: 1 Month  Remember:   Should you have any questions or concerns please call the internal medicine clinic at [phone].    Edward [name], M.D [facility]

## 2023-10-31 NOTE — Telephone Encounter (Signed)
 Call to [facility] in [place].  Provided DEA #.for Dr. Amoako will rerun to see if it will be accepted.   Copied from CRM [id number]. 

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
## 2023-11-20 ENCOUNTER — Other Ambulatory Visit: Payer: Self-pay

## 2023-11-22 MED ORDER — ESCITALOPRAM OXALATE 10 MG PO TABS: 10.0000 mg | ORAL_TABLET | Freq: Every day | ORAL | 1 refills | Status: DC

## 2023-11-30 IMAGING — MR MR HIP*R* W/O CM
7 series · 40 of 40 positions shown · non-contrast
Comparison: AP pelvis 06/07/2021, CT abdomen and pelvis 05/24/2021

CLINICAL DATA: Hip pain. Osteonecrosis suspected. Lower back pain
and bilateral hip pain for over 9 months.

EXAM:
MR OF THE RIGHT HIP WITHOUT CONTRAST
TECHNIQUE: Multiplanar, multisequence MR imaging was performed. No intravenous
contrast was administered.

[Series 11: T1 · coronal · B · 4.0mm · 0.74mm/px · 7 of 30 slices shown]
[im 1/30]
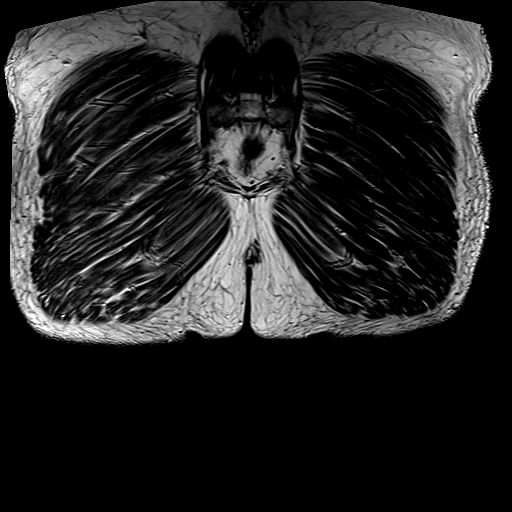
[im 5/30]
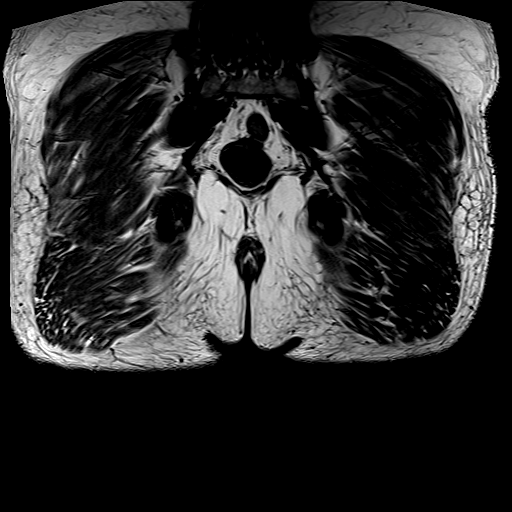
[im 10/30]
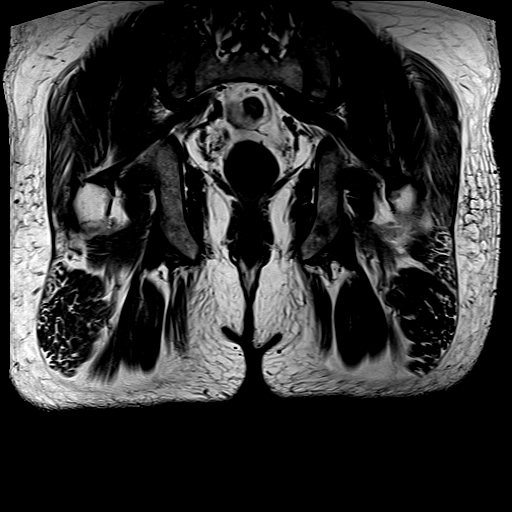
[im 15/30]
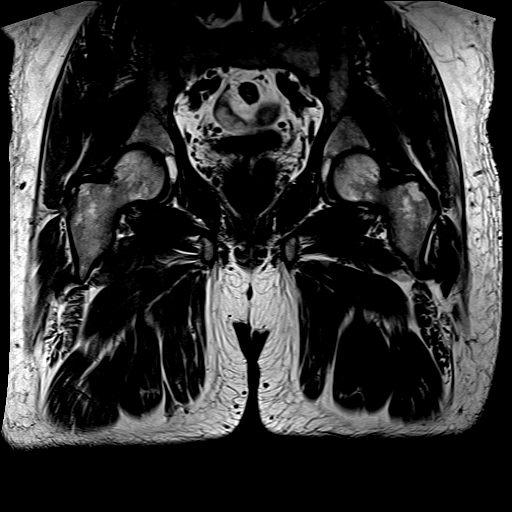
[im 20/30]
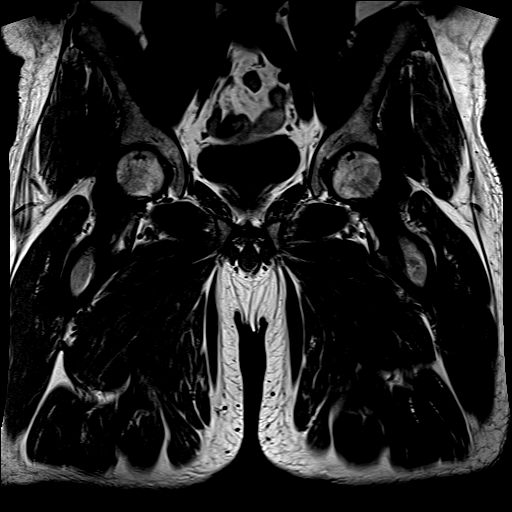
[im 25/30]
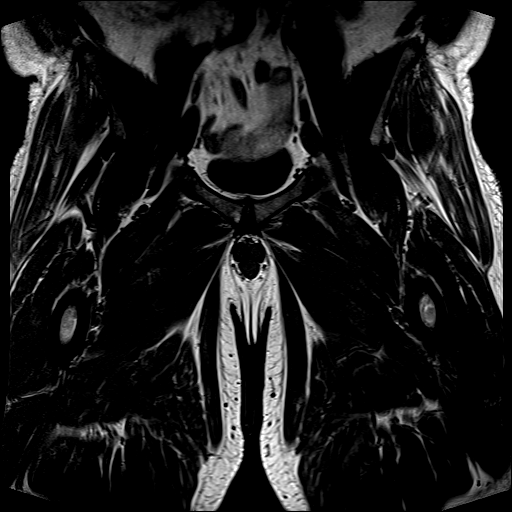
[im 30/30]
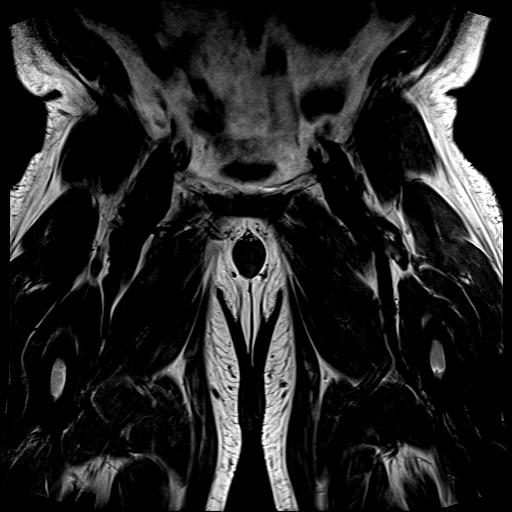

[Series 12: STIR · coronal · B · 4.0mm · 0.99mm/px · 7 of 30 slices shown]
[im 1/30]
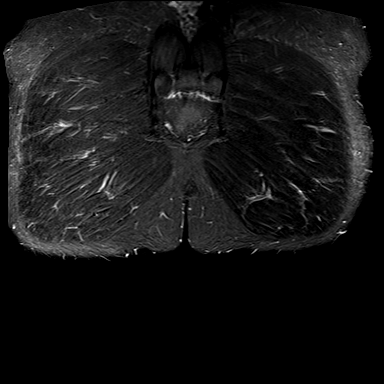
[im 5/30]
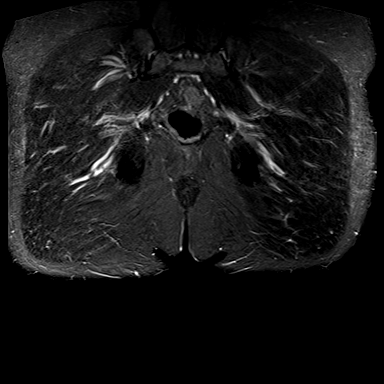
[im 10/30]
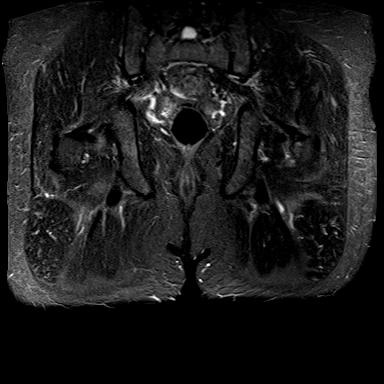
[im 15/30]
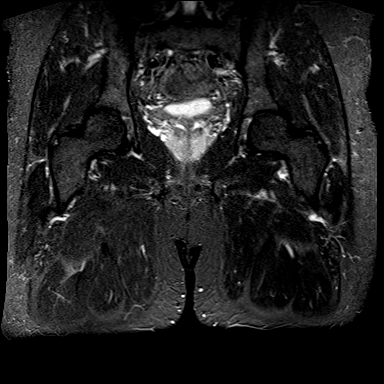
[im 20/30]
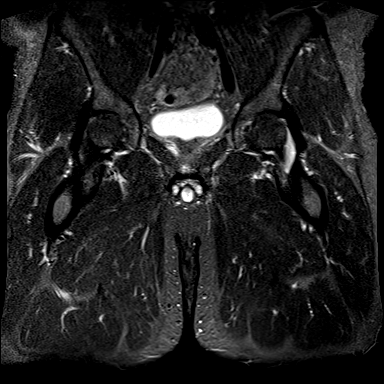
[im 25/30]
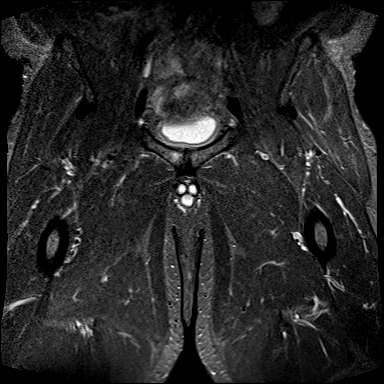
[im 30/30]
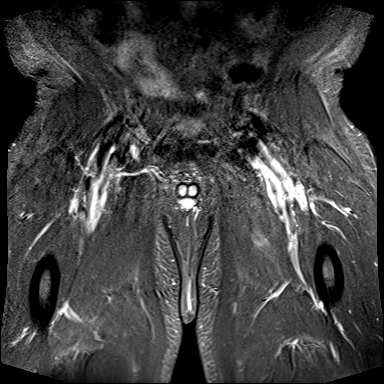

[Series 13: T2 fat-sat · axial · B · 4.0mm · 1.61mm/px · z∈[-296,-126]mm · 8 of 35 slices shown]
[im 1/35]
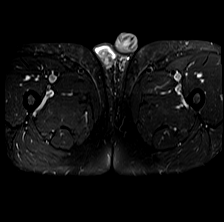
[im 5/35]
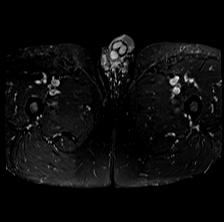
[im 10/35]
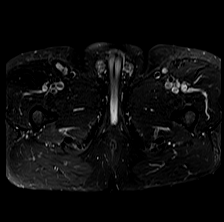
[im 15/35]
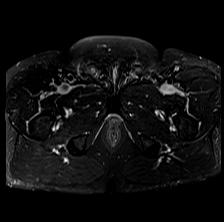
[im 20/35]
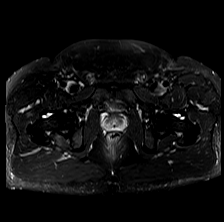
[im 25/35]
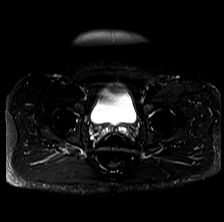
[im 30/35]
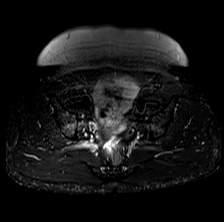
[im 35/35]
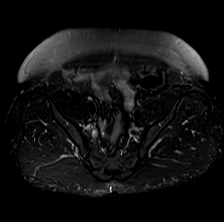

[Series 14: PD fat-sat · sagittal · B · 4.0mm · 0.70mm/px · 5 of 25 slices shown (1 of 4)]
[im 1/25]
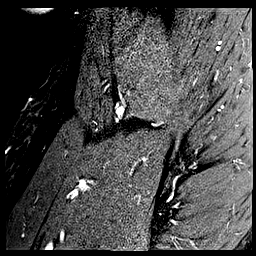
[im 7/25]
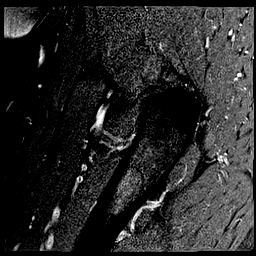
[im 13/25]
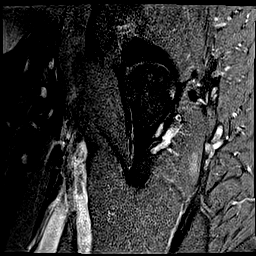
[im 19/25]
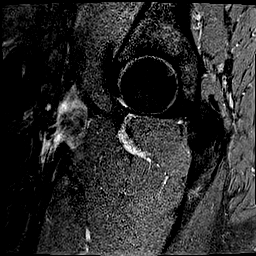
[im 25/25]
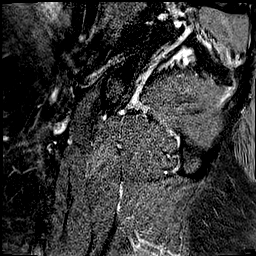

[Series 15: PD fat-sat · coronal · B · 4.0mm · 0.70mm/px · 4 of 20 slices shown (2 of 4)]
[im 1/20]
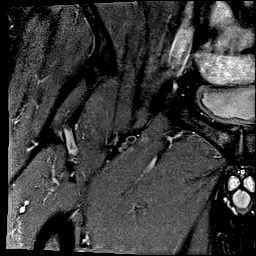
[im 7/20]
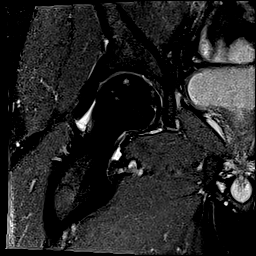
[im 13/20]
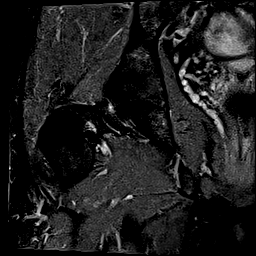
[im 20/20]
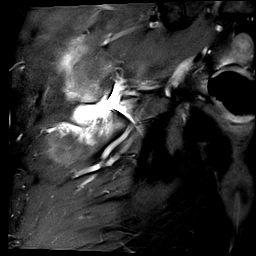

[Series 16: PD fat-sat · sagittal · B · 4.0mm · 0.70mm/px · 5 of 25 slices shown (3 of 4)]
[im 1/25]
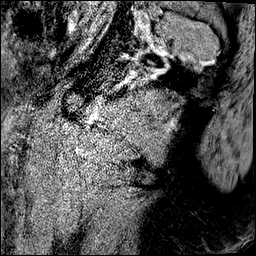
[im 7/25]
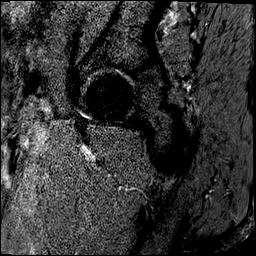
[im 13/25]
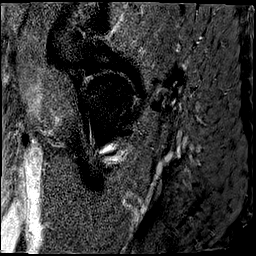
[im 19/25]
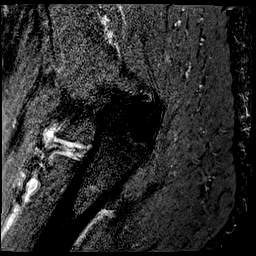
[im 25/25]
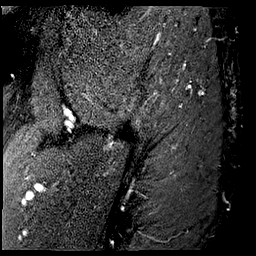

[Series 17: PD fat-sat · coronal · B · 4.0mm · 0.70mm/px · 4 of 20 slices shown (4 of 4)]
[im 1/20]
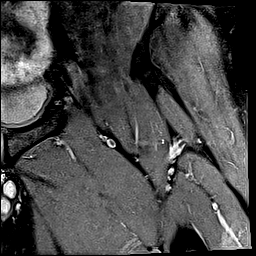
[im 7/20]
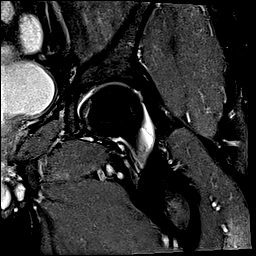
[im 13/20]
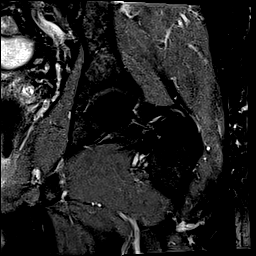
[im 20/20]
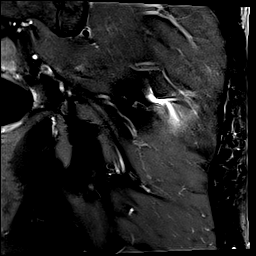

[40 of 40 positions shown; findings below may reference images not displayed]

FINDINGS: Bones: As seen on prior CT, there is serpiginous predominantly
sclerotic decreased T1 and decreased T2 signal with areas of patchy
peripheral increased T2 signal likely healing granulation tissue
within the anterior superior right-greater-than-left femoral heads.
This is seen in a region measuring up to 2.3 x 2.4 x 0.8 cm in
greatest transverse by AP by craniocaudal measurements on the right
and 2.2 x 1.8 x 0.7 cm on the left. There is no surrounding marrow
edema. This is consistent with early avascular necrosis. No
overlying cortical collapse is seen.

Mild joint space narrowing and peripheral osteophytosis degenerative
changes of the pubic symphysis with moderate right and mild left
pubic body subchondral marrow edema.

Articular cartilage and labrum

Articular cartilage: Mild-to-moderate bilateral anterior superior
femoral head and acetabular cartilage thinning.

Labrum: Minimal bilateral anterior superior acetabular degenerative
attenuation.

Joint or bursal effusion

Joint effusion:  No joint effusion within either hip.

Bursae: No trochanteric bursitis on either side.

Muscles and tendons

Muscles and tendons: The origins of the bilateral rectus femoris
tendons and the bilateral common hamstring tendon origins are
intact. The insertions of the bilateral gluteus minimus gluteus
medius and iliopsoas tendons are intact. The rectus
abdominis-adductor aponeuroses are intact.

Other findings

Miscellaneous: No significant abnormality is seen within the
intrapelvic soft tissues.
IMPRESSION: :
IMPRESSION: 1. Mild-to-moderate right greater than left avascular necrosis of
the anterior superior femoral heads. No overlying cortical collapse.
No significant acute marrow edema.
2. Mild pubic symphysis osteoarthritis with right-greater-than-left
subchondral marrow edema suggesting this may represent a source of
pain.
3. Mild-to-moderate bilateral anterior superior femoral head and
acetabular cartilage thinning.

## 2023-11-30 IMAGING — MR MR LUMBAR SPINE W/O CM
5 series · 31 of 48 positions shown · non-contrast
Comparison: Radiographs June 07, 2021

CLINICAL DATA: Lumbar pain UPU.PT (WQP-W5-CM). Radicular pain of
left lower extremity DUZ.4P (WQP-W5-CM). Pain in left hip
(WQP-W5-CM).

EXAM:
MRI LUMBAR SPINE WITHOUT CONTRAST
TECHNIQUE: Multiplanar, multisequence MR imaging of the lumbar spine was
performed. No intravenous contrast was administered.

[Series 5: T2 · sagittal · 4.0mm · 0.68mm/px · 5 of 13 slices shown (1 of 2)]
[im 1/13]
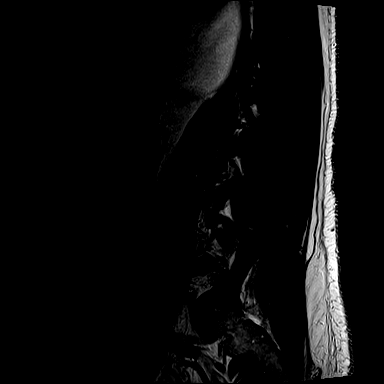
[im 4/13]
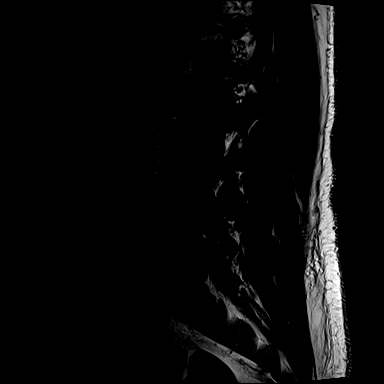
[im 7/13]
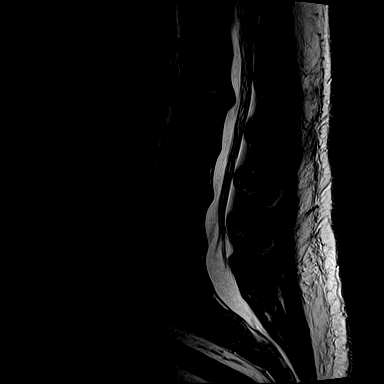
[im 10/13]
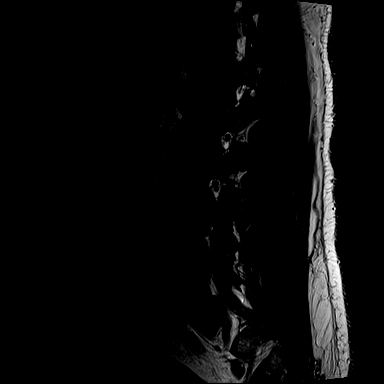
[im 13/13]
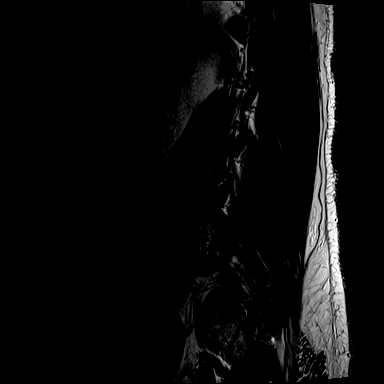

[Series 6: T1 · sagittal · 4.0mm · 0.81mm/px · 7 of 15 slices shown (1 of 2)]
[im 1/15]
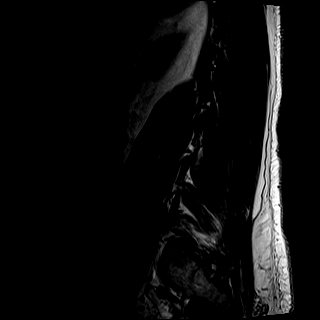
[im 3/15]
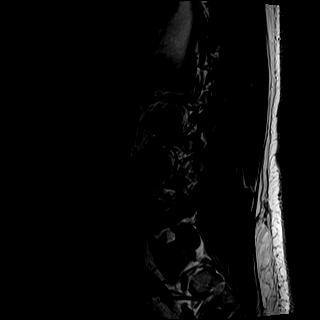
[im 5/15]
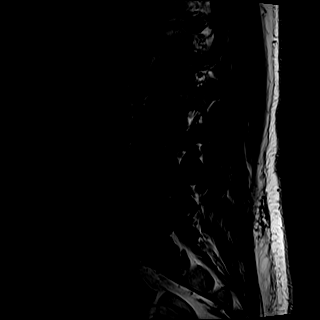
[im 8/15]
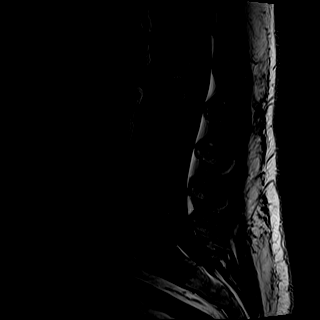
[im 10/15]
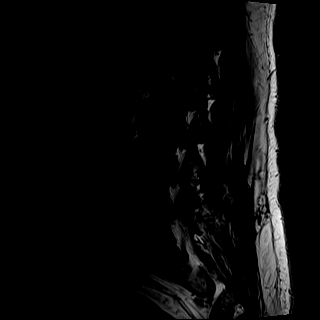
[im 12/15]
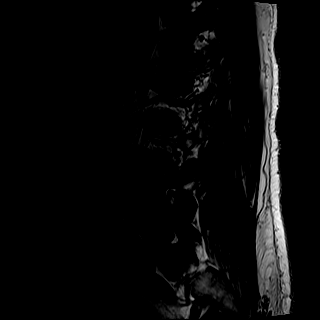
[im 15/15]
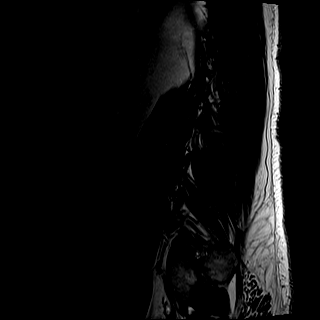

[Series 7: STIR · sagittal · 4.0mm · 0.51mm/px · 1 of 13 slices shown]
[im 1/13]
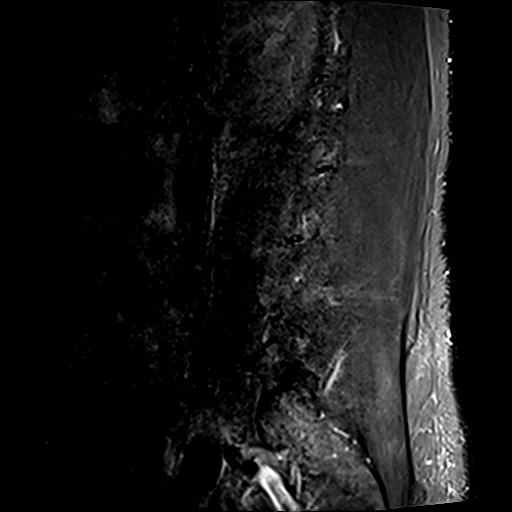

[Series 8: T2 · axial · 4.0mm · 0.70mm/px · z∈[-84,+120]mm · 9 of 33 slices shown (2 of 2)]
[im 1/33]
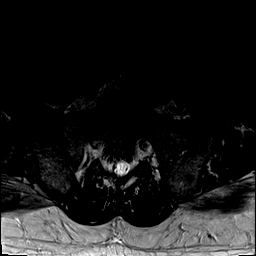
[im 5/33]
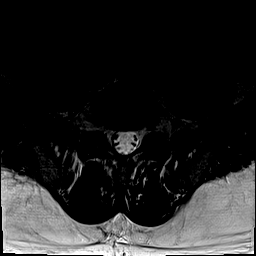
[im 10/33]
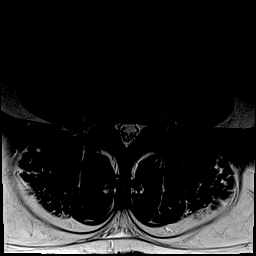
[im 14/33]
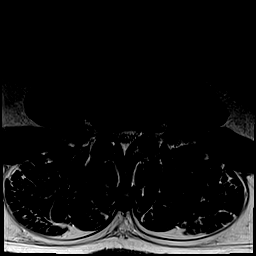
[im 17/33]
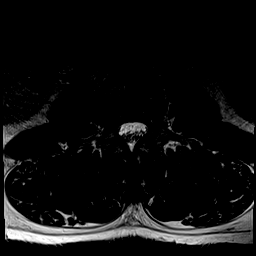
[im 19/33]
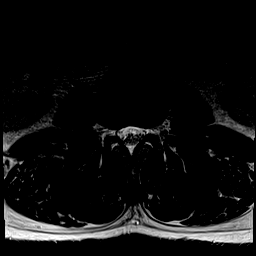
[im 23/33]
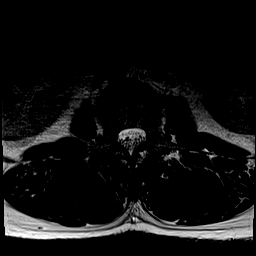
[im 28/33]
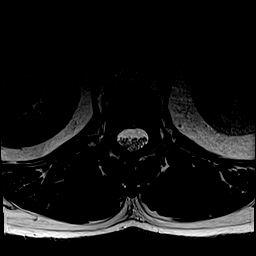
[im 33/33]
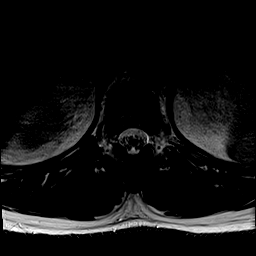

[Series 9: T1 · axial · 4.0mm · 0.35mm/px · z∈[-84,+120]mm · 9 of 33 slices shown (2 of 2)]
[im 1/33]
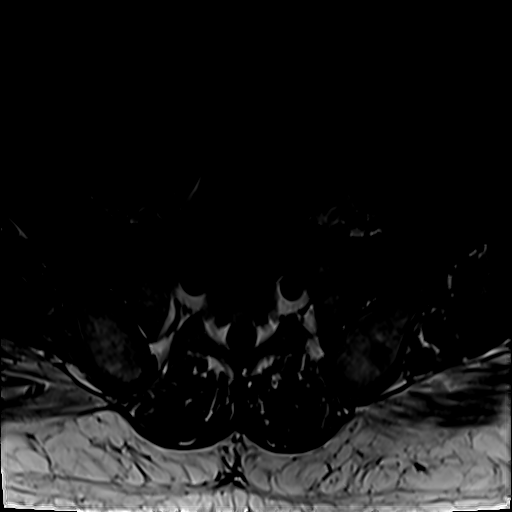
[im 5/33]
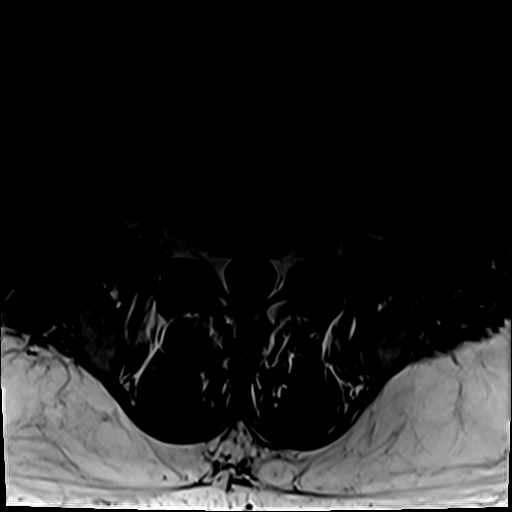
[im 10/33]
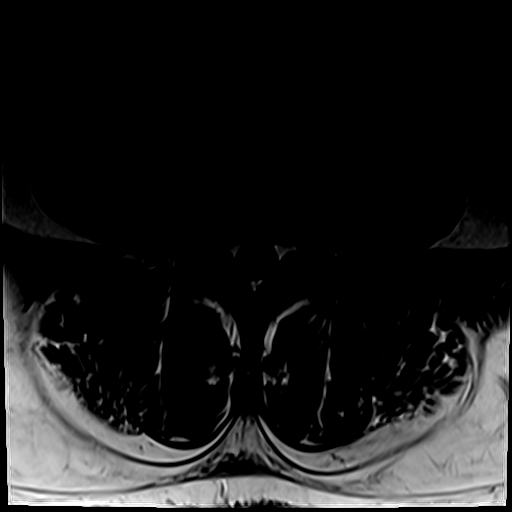
[im 14/33]
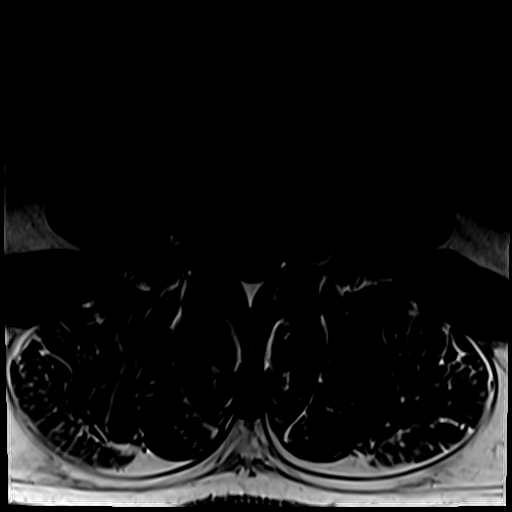
[im 17/33]
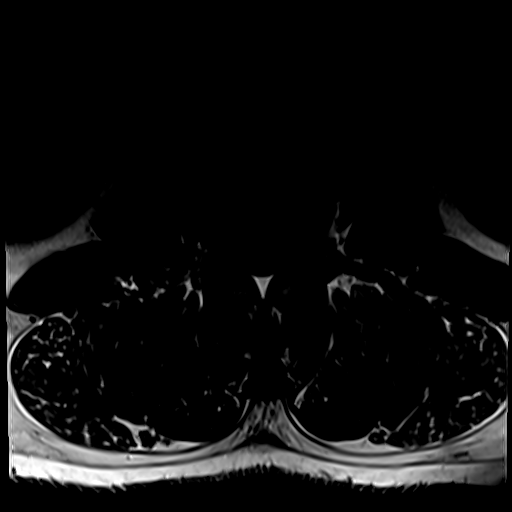
[im 19/33]
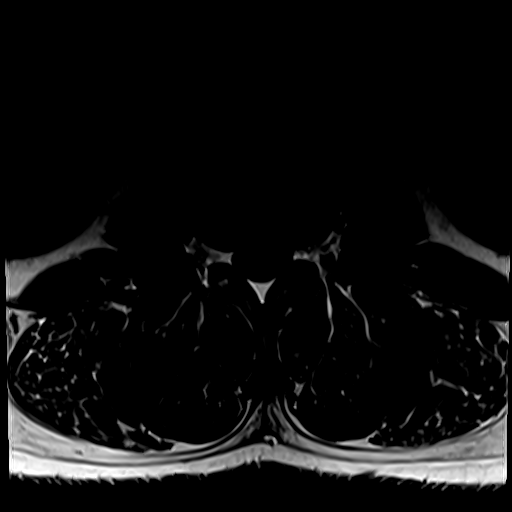
[im 23/33]
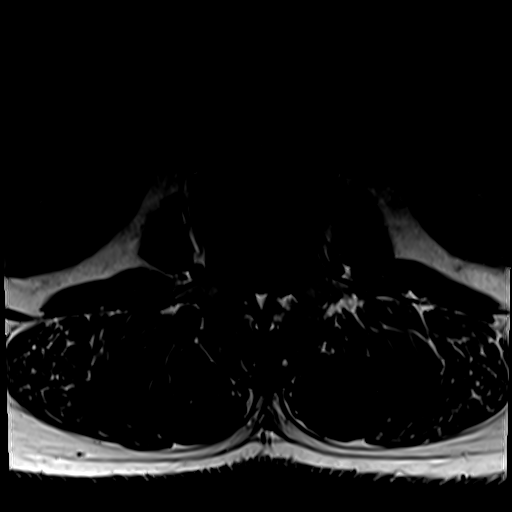
[im 28/33]
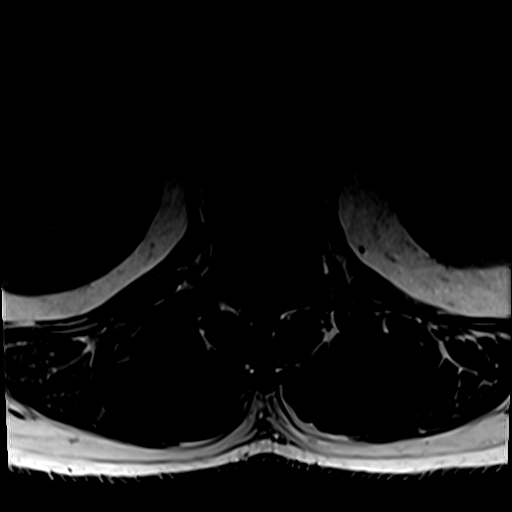
[im 33/33]
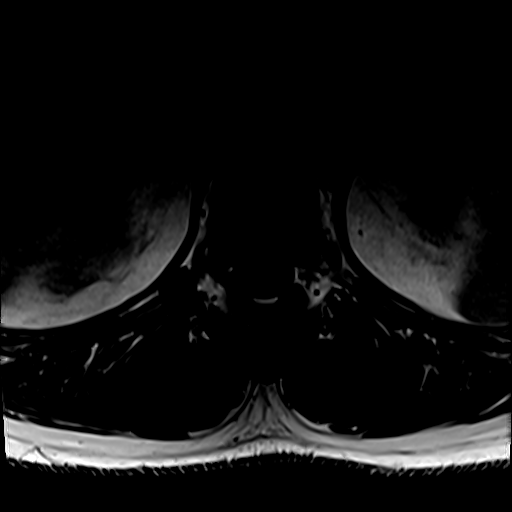

[31 of 48 positions shown; findings below may reference images not displayed]

FINDINGS: Segmentation:  Standard.

Alignment:  Mild Dextroconvex scoliosis.

Vertebrae: No fracture, evidence of discitis, or bone lesion.
Endplate degenerative changes at L1-2.

Conus medullaris and cauda equina: Conus extends to the L1 level.
Conus and cauda equina appear normal.

Paraspinal and other soft tissues: Negative.

Disc levels:

T12-L1: No spinal canal or neural foraminal stenosis.

L1-2: Loss of disc height, disc bulge and mild facet degenerative
changes resulting in mild spinal canal stenosis. No significant
neural foraminal narrowing.

L2-3: Shallow disc bulge and mild facet degenerative changes. No
spinal canal or neural foraminal stenosis.

L3-4: Disc bulge slightly asymmetric to the right and mild facet
degenerative changes resulting in mild spinal canal stenosis with
narrowing of the bilateral subarticular zone and mild bilateral
neural foraminal narrowing.

L4-5: Disc bulge and mild facet degenerative changes resulting in
mild bilateral neural foraminal narrowing. No significant spinal
canal stenosis.

L5-S1: Mild facet degenerative changes. No spinal canal or neural
foraminal stenosis.
IMPRESSION: 1. Mild degenerative changes of the lumbar spine with mild spinal
canal stenosis at L1-2 and L3-4.
2. Mild bilateral neural foraminal narrowing at L3-4 and L4-5.

## 2023-12-11 ENCOUNTER — Ambulatory Visit: Payer: Self-pay

## 2023-12-11 NOTE — Telephone Encounter (Signed)
 RTC to patient message left that the were calling to check on his hand swelling to see if there is any heat or redness.  Encouraged patient to keep appointment for tomorrow
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
## 2023-12-12 ENCOUNTER — Ambulatory Visit: Payer: Self-pay | Admitting: Student

## 2023-12-13 NOTE — Telephone Encounter (Signed)
 Call to patient this afternoon.  Message was left that the Clinics had called to check on his hand and to see why he missed his  appointment that he had this morning.  Has an appointment scheduled for 12/20/2022.

## 2023-12-17 ENCOUNTER — Telehealth: Payer: Self-pay | Admitting: Student

## 2023-12-17 DIAGNOSIS — F1190 Opioid use, unspecified, uncomplicated: Secondary | ICD-10-CM
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
## 2023-12-18 ENCOUNTER — Other Ambulatory Visit: Payer: Self-pay | Admitting: Student

## 2023-12-19 NOTE — Telephone Encounter (Signed)
 Pharmacy requesting a 90 day supply

## 2023-12-20 ENCOUNTER — Ambulatory Visit: Payer: Self-pay

## 2023-12-21 ENCOUNTER — Ambulatory Visit (INDEPENDENT_AMBULATORY_CARE_PROVIDER_SITE_OTHER): Admitting: Student

## 2023-12-21 ENCOUNTER — Encounter: Payer: Self-pay | Admitting: Student

## 2023-12-21 VITALS — BP 148/89 | HR 70 | Temp 97.8°F | Ht 69.0 in | Wt 210.0 lb

## 2023-12-21 DIAGNOSIS — F32A Depression, unspecified: Secondary | ICD-10-CM

## 2023-12-21 DIAGNOSIS — F419 Anxiety disorder, unspecified: Secondary | ICD-10-CM

## 2023-12-21 DIAGNOSIS — F1190 Opioid use, unspecified, uncomplicated: Secondary | ICD-10-CM | POA: Diagnosis not present

## 2023-12-21 MED ORDER — ESCITALOPRAM OXALATE 10 MG PO TABS: 20.0000 mg | ORAL_TABLET | Freq: Every day | ORAL | 11 refills | Status: DC

## 2023-12-21 MED ORDER — BUPRENORPHINE HCL-NALOXONE HCL 8-2 MG SL SUBL
1.0000 | SUBLINGUAL_TABLET | Freq: Three times a day (TID) | SUBLINGUAL | 0 refills | Status: DC
Start: 2023-12-21 — End: 2023-12-27

## 2023-12-21 NOTE — Assessment & Plan Note (Addendum)
 Patient has been on chronic Suboxone  therapy. Currently out of medications; has been unable to make it to the office for the last 3 office visits due to lack of transportation.  Last Toxassure with methamphetamines as well as buprenorphine .  He endorses THC use and methamphetamine, both of which he used yesterday.  Has not used heroin.  Reports increased stressors in his life (see depression tab).   Discussed the need to keep the patient accountable by increasing the frequency of [facility] doctors; limited by transportation. Will have him alternate Telehealth visits with in-person visits (once per month). As for the use of metamphetamines, discussed the need to discontinue it. I don't think this is a reason to discontinue therapy as he has not continued taking heroin, as such, will continue for harm reduction.   Will address mood changes and stressors with increasing SSRI dosing and referral to therapy Will refill his Suboxone  prescription for 30 days; should continue Telehealth visit in 2 weeks; in person appt in 4 weeks Toxassure test today

## 2023-12-21 NOTE — Patient Instructions (Addendum)
.  Thank you, Mr.Satish E Gelin for allowing us  to provide your care today. Today we discussed   Chronic suboxone  treatment:  Congratulations for not doing heroin. For pain, please follow up with your specialists. We will continue refilling your suboxone  30 day supplies if you keep up with the appointements - You will do Telehealth appointments AND In person visits. The next one should be Telehealth and the following one (in 4 weeks) for in-person to continue prescribing your medications  Depression: - I am upping the dose of your lexapro . Take 2 of the lexapro  pills (20 mg total) daily - I am also referring you to [facility], here at the clinic for Telehealth therapy sessions  Call your insurance company to see if they can help you with transportation  I encourage you to not take medications from the street, especially amphetamines. We are here to help you and support you.  I have ordered the following labs for you:   Lab Orders         ToxAssure Select,+Antidepr,UR      I will call if any are abnormal. All of your labs can be accessed through My Chart.   My Chart Access: [email or web address]  Please follow-up in: 2 weeks for telehealth and in 4 weeks in person    We look forward to seeing you next time. Please call our clinic at [phone] if you have any questions or concerns. The best time to call is Monday-Friday from 9am-4pm, but there is someone available 24/7. If after hours or the weekend, call the main hospital number and ask for the Internal Medicine Resident On-Call. If you need medication refills, please notify your pharmacy one week in advance and they will send us  a request.   Thank you for letting us  take part in your care. Wishing you the best!  Elnora Ip, MD 12/21/2023, 9:48 AM [facility]

## 2023-12-21 NOTE — Assessment & Plan Note (Signed)
 Patient initially on Celexa, which did not help. Was transitioned to lexapro  10 mg daily; improvement in the PHQ-9 score - 9  today. Has had recent death in family, likely contributory. There is also housing insecurity and lack of transportation. Main stressors are depressed mood. He is sleeping better and is able to engage in more activities.  - referral to [facility]; will prioritize Telehealth appts given transportation difficulties - Increased Lexapro  to 20 mg daily; prescribed as 2 tablets of 10 mg because of insurance coverage - Telehealth appointment in 2 weeks with doctor

## 2023-12-21 NOTE — Progress Notes (Signed)
 Subjective:  CC: Suboxone    HPI:  Edward [name] is a 53 y.o. male with a past medical history stated below and presents today for opiate use disorder treatment. Please see problem based assessment and plan for additional details.  Past Medical History:  Diagnosis Date   Bronchitis    GERD (gastroesophageal reflux disease)     Current Outpatient Medications on File Prior to Visit  Medication Sig Dispense Refill   acetaminophen  (TYLENOL ) 500 MG tablet Take 1,000-1,500 mg by mouth daily as needed for headache or moderate pain.     amLODipine  (NORVASC ) 5 MG tablet Take 1 tablet (5 mg total) by mouth daily. 30 tablet 11   gabapentin  (NEURONTIN ) 300 MG capsule Take 1 capsule (300 mg total) by mouth 4 (four) times daily. 90 capsule 5   ibuprofen (ADVIL) 200 MG tablet Take 400-600 mg by mouth daily as needed for moderate pain or headache.     lidocaine  (LIDODERM ) 5 % Place 1 patch onto the skin daily. Remove & Discard patch within 12 hours or as directed by MD 30 patch 0   methocarbamol  (ROBAXIN ) 500 MG tablet Take 1 tablet (500 mg total) by mouth 2 (two) times daily as needed for muscle spasms. 20 tablet 0   metoCLOPramide  (REGLAN ) 5 MG tablet Take 1 tablet (5 mg total) by mouth every 6 (six) hours as needed. for nausea 120 tablet 0   naproxen  (NAPROSYN ) 500 MG tablet Take 1 tablet (500 mg total) by mouth 2 (two) times daily with a meal. 30 tablet 0   ondansetron  (ZOFRAN ) 4 MG tablet TAKE 1 TABLET BY MOUTH EVERY 8 HOURS AS NEEDED 60 tablet 1   pantoprazole  (PROTONIX ) 40 MG tablet Take 1 tablet (40 mg total) by mouth 2 (two) times daily before a meal. 60 tablet 11   promethazine  (PHENERGAN ) 12.5 MG suppository Place 1 suppository (12.5 mg total) rectally every 6 (six) hours as needed for nausea or vomiting. 12 each 0   promethazine  (PHENERGAN ) 12.5 MG tablet Take 1 tablet (12.5 mg total) by mouth every 6 (six) hours as needed for nausea or vomiting. 30 tablet 2   SUMAtriptan  (IMITREX ) 20  MG/ACT nasal spray Place 1 spray (20 mg total) into the nose every 2 (two) hours as needed for migraine or headache. To be administered during the prodrome or shortly after vomiting begins (eg, within 30 to 45 minutes).May repeat in 2 hours if vomiting persists or recurs. No More than 6 doses / week. 6 each 2   No current facility-administered medications on file prior to visit.    Family History  Problem Relation Age of Onset   Diabetes Mother    Heart disease Father    Prostate cancer Father     Social History   Socioeconomic History   Marital status: Married    Spouse name: Not on file   Number of children: Not on file   Years of education: Not on file   Highest education level: Not on file  Occupational History   Not on file  Tobacco Use   Smoking status: Never    Passive exposure: Current   Smokeless tobacco: Never  Vaping Use   Vaping status: Never Used  Substance and Sexual Activity   Alcohol use: Not Currently   Drug use: Yes    Types: Marijuana   Sexual activity: Yes  Other Topics Concern   Not on file  Social History Narrative   Not on file   Social  Drivers of Corporate investment banker Strain: Not on file  Food Insecurity: No Food Insecurity (03/25/2022)   Hunger Vital Sign    Worried About Running Out of Food in the Last Year: Never true    Ran Out of Food in the Last Year: Never true  Transportation Needs: No Transportation Needs (03/25/2022)   PRAPARE - Administrator, Civil Service (Medical): No    Lack of Transportation (Non-Medical): No  Physical Activity: Not on file  Stress: Not on file  Social Connections: Socially Integrated (03/25/2022)   Social Connection and Isolation Panel    Frequency of Communication with Friends and Family: Three times a week    Frequency of Social Gatherings with Friends and Family: More than three times a week    Attends Religious Services: More than 4 times per year    Active Member of [employer] or  Organizations: Not on file    Attends Banker Meetings: 1 to 4 times per year    Marital Status: Married  Catering manager Violence: Not At Risk (03/25/2022)   Humiliation, Afraid, Rape, and Kick questionnaire    Fear of Current or Ex-Partner: No    Emotionally Abused: No    Physically Abused: No    Sexually Abused: No    Review of Systems: ROS negative except for what is noted on the assessment and plan.  Objective:   Vitals:   12/21/23 0911  BP: (!) 148/89  Pulse: 70  Temp: 97.8 F (36.6 C)  TempSrc: Oral  SpO2: 99%  Weight: 210 lb (95.3 kg)  Height: 5' 9 (1.753 m)    Physical Exam: Constitutional: well-appearing man sitting in chair, in no acute distress Cardiovascular: regular rate and rhythm, no m/r/g Pulmonary/Chest: normal work of breathing on room air, lungs clear to auscultation bilaterally Abdominal: soft, non-tender, non-distended Neurological: alert & oriented x 3 Skin: warm and dry Psych: Sad mood and affect       12/21/2023    9:28 AM  Depression screen PHQ 2/9  Down, Depressed, Hopeless 2  PHQ - 2 Score 2  Altered sleeping 3  Tired, decreased energy 1  Change in appetite 0  Feeling bad or failure about yourself  2  Trouble concentrating 1  Moving slowly or fidgety/restless 0  Suicidal thoughts 0  PHQ-9 Score 9  Difficult doing work/chores Somewhat difficult     Assessment & Plan:   Opioid use disorder Patient has been on chronic Suboxone  therapy. Currently out of medications; has been unable to make it to the office for the last 3 office visits due to lack of transportation.  Last Toxassure with methamphetamines as well as buprenorphine .  He endorses THC use and methamphetamine, both of which he used yesterday.  Has not used heroin.  Reports increased stressors in his life (see depression tab).   Discussed the need to keep the patient accountable by increasing the frequency of [facility] doctors; limited by transportation. Will have him  alternate Telehealth visits with in-person visits (once per month). As for the use of metamphetamines, discussed the need to discontinue it. I don't think this is a reason to discontinue therapy as he has not continued taking heroin, as such, will continue for harm reduction.   Will address mood changes and stressors with increasing SSRI dosing and referral to therapy Will refill his Suboxone  prescription for 30 days; should continue Telehealth visit in 2 weeks; in person appt in 4 weeks Toxassure test today  Anxiety  and depression Patient initially on Celexa, which did not help. Was transitioned to lexapro  10 mg daily; improvement in the PHQ-9 score - 9  today. Has had recent death in family, likely contributory. There is also housing insecurity and lack of transportation. Main stressors are depressed mood. He is sleeping better and is able to engage in more activities.  - referral to [facility]; will prioritize Telehealth appts given transportation difficulties - Increased Lexapro  to 20 mg daily; prescribed as 2 tablets of 10 mg because of insurance coverage - Telehealth appointment in 2 weeks with doctor    Return for 2 week Telehealth for depression, and in 4 weeks for opiate use treatment.  Patient discussed with Dr. Karna Hadassah Kristy Rosario, MD [facility]  12/21/2023, 6:06 PM

## 2023-12-22 NOTE — Progress Notes (Signed)
 Internal Medicine Clinic Attending  Case discussed with the resident at the time of the visit.  We reviewed the resident's history and exam and pertinent patient test results.  I agree with the assessment, diagnosis, and plan of care documented in the resident's note.

## 2023-12-26 LAB — TOXASSURE SELECT,+ANTIDEPR,UR

## 2023-12-27 ENCOUNTER — Encounter

## 2023-12-27 ENCOUNTER — Ambulatory Visit: Payer: Self-pay | Admitting: Student

## 2023-12-27 ENCOUNTER — Other Ambulatory Visit: Payer: Self-pay | Admitting: *Deleted

## 2023-12-27 ENCOUNTER — Ambulatory Visit (INDEPENDENT_AMBULATORY_CARE_PROVIDER_SITE_OTHER): Admitting: Student

## 2023-12-27 VITALS — BP 137/89 | HR 70 | Temp 98.0°F | Ht 69.0 in | Wt 212.4 lb

## 2023-12-27 DIAGNOSIS — M19042 Primary osteoarthritis, left hand: Secondary | ICD-10-CM

## 2023-12-27 DIAGNOSIS — I10 Essential (primary) hypertension: Secondary | ICD-10-CM

## 2023-12-27 DIAGNOSIS — Z791 Long term (current) use of non-steroidal anti-inflammatories (NSAID): Secondary | ICD-10-CM

## 2023-12-27 DIAGNOSIS — M19041 Primary osteoarthritis, right hand: Secondary | ICD-10-CM | POA: Diagnosis not present

## 2023-12-27 DIAGNOSIS — F1190 Opioid use, unspecified, uncomplicated: Secondary | ICD-10-CM | POA: Diagnosis not present

## 2023-12-27 MED ORDER — ACETAMINOPHEN ER 650 MG PO TBCR: 1300.0000 mg | EXTENDED_RELEASE_TABLET | Freq: Two times a day (BID) | ORAL | 0 refills | Status: AC | PRN

## 2023-12-27 MED ORDER — NAPROXEN 500 MG PO TABS: 500.0000 mg | ORAL_TABLET | Freq: Two times a day (BID) | ORAL | 2 refills | Status: AC

## 2023-12-27 MED ORDER — DICLOFENAC SODIUM 1 % EX GEL: 4.0000 g | Freq: Four times a day (QID) | CUTANEOUS | 3 refills | Status: AC

## 2023-12-27 MED ORDER — AMLODIPINE BESYLATE 5 MG PO TABS: 5.0000 mg | ORAL_TABLET | Freq: Every day | ORAL | 11 refills | Status: AC

## 2023-12-27 MED ORDER — BUPRENORPHINE HCL-NALOXONE HCL 8-2 MG SL SUBL: 1.0000 | SUBLINGUAL_TABLET | Freq: Three times a day (TID) | SUBLINGUAL | 0 refills | Status: DC

## 2023-12-27 NOTE — Assessment & Plan Note (Signed)
 Patient could not pick up medication from [facility]. Nurse Gaetana called and cancelled this prescription. I will resend today to [facility] [address] in [place], [place]

## 2023-12-27 NOTE — Telephone Encounter (Signed)
 Copied from CRM #[id number]. Topic: Clinical - Prescription Issue >> Dec 27, 2023 11:16 AM Susanna ORN wrote: Reason for CRM: Patient's wife, Crystal, called and stated that patient's buprenorphine -naloxone  (SUBOXONE ) 8-2 mg SUBL SL tablet needs to be [facility] [place]. She states [facility] [place] [facility] can't fill it. CB #: [phone]

## 2023-12-27 NOTE — Patient Instructions (Addendum)
 Thank you, Mr.Hulon E Neubauer for allowing us  to provide your care today. Today we discussed   Osteoarthritis of your hands - Naproxen  500 mg twice daily for 10-14 days - Acetaminophen  2 tablets of .  650 mg pills wice daily for 10-14 days - voltaren  gel three times daily  At the end of the 14 days, use these medications as needed  Hand Arthritis Exercises  Finger and Wrist Movements:  Finger extensions: Spread your fingers out wide and hold for 5 seconds.  Finger flexions: Curl your fingers into your palm and hold for 5 seconds.  Wrist flexion: Bend your wrist down towards your palm.  Wrist extension: Bend your wrist back towards your forearm.  Thumb rotations: Circle your thumb in both directions.   Specific Finger Exercises:  Finger lifts: Raise one finger at a time off the table.  Knuckle benders: Bend and straighten the knuckles of each finger.  Thumb stretches: Extend your thumb across your palm and hold for 5 seconds.   Strengthening Exercises:  Squeezing a stress ball or sponge: Repeat 10-15 times.  Holding a weight in your hand: Lift and lower the weight slowly.  Using hand grips: Squeeze the grips with increasing force.   Other Exercises:  Finger tapping: Tap your fingers on a table or desk. Hand drumming: Gently drum your fingers on a surface. Finger spreading: Spread your fingers apart as wide as possible. Wrist circles: Make circles with your wrists in both directions.  Tips:  Start slowly and gradually increase the intensity and duration of the exercises.  Stop if you experience pain.  Perform these exercises regularly, ideally several times a day.  Consult with a healthcare professional for personalized guidance and to rule out any underlying medical conditions.   TAKE ALL THESE MEDICATIONS WITH FOOD If heart burn, please take Prilosec or any other over the counter acid suppressant   My Chart  Access: [email or web address]     We look forward to seeing you next time. Please call our clinic at [phone] if you have any questions or concerns. The best time to call is Monday-Friday from 9am-4pm, but there is someone available 24/7. If after hours or the weekend, call the main hospital number and ask for the Internal Medicine Resident On-Call. If you need medication refills, please notify your pharmacy one week in advance and they will send us  a request.   Thank you for letting us  take part in your care. Wishing you the best!  Elnora Ip, MD 12/27/2023, 10:45 AM [facility]

## 2023-12-27 NOTE — Telephone Encounter (Signed)
 Pt's wife stated they have moved to [place]; need BP med and Suboxone  rxs [facility] [place]. Pt saw Dr Elnora this am.

## 2023-12-27 NOTE — Telephone Encounter (Signed)
 Filled Suboxone  and amlodipine  medications.  Thank you, RN Glenda.
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
## 2023-12-27 NOTE — Telephone Encounter (Signed)
 [facility] [place] - pt [facility] not pick upSoboxone rx on 9/4; pt and pt's wife wanted a different [facility], the one in [place] on [address] as noted in previous note. [facility] in [place] will deactivate their rx.

## 2023-12-28 ENCOUNTER — Other Ambulatory Visit: Payer: Self-pay | Admitting: *Deleted

## 2023-12-28 ENCOUNTER — Encounter

## 2023-12-28 ENCOUNTER — Telehealth: Payer: Self-pay | Admitting: *Deleted

## 2023-12-28 DIAGNOSIS — F1190 Opioid use, unspecified, uncomplicated: Secondary | ICD-10-CM

## 2023-12-28 MED ORDER — BUPRENORPHINE HCL-NALOXONE HCL 8-2 MG SL SUBL: 1.0000 | SUBLINGUAL_TABLET | Freq: Three times a day (TID) | SUBLINGUAL | 0 refills | Status: DC

## 2023-12-28 NOTE — Telephone Encounter (Signed)
 Copied from CRM #[id number]. Topic: Clinical - Prescription Issue >> Dec 27, 2023 11:16 AM Susanna ORN wrote: Reason for CRM: Patient's wife, Crystal, called and stated that patient's buprenorphine -naloxone  (SUBOXONE ) 8-2 mg SUBL SL tablet needs to be [facility] to [facility] in [place]. She states [facility] in [place] states they can't fill it. CB #: [id number] >> Dec 28, 2023  9:53 AM Alfonso ORN wrote: Please contact spouse Crystal Beaver to let know the status of the refill medication at [phone] >> Dec 28, 2023  9:50 AM Alfonso ORN wrote: Patient spouse Crystal Hodo calling on the status of the medication refill for the buprenorphine -naloxone  (SUBOXONE ) 8-2 mg SUBL SL tablet . Crystal call the pharmacy this morning and they do not have the medication refill yet

## 2023-12-28 NOTE — Telephone Encounter (Signed)
 Copied from CRM #[id number]. Topic: Clinical - Prescription Issue >> Dec 27, 2023 11:16 AM Susanna ORN wrote: Reason for CRM: Patient's wife, Crystal, called and stated that patient's buprenorphine -naloxone  (SUBOXONE ) 8-2 mg SUBL SL tablet needs to be sent to [facility] in [place]. She states [facility] in [place] states they can't fill it. CB #: [id number] >> Dec 28, 2023  9:53 AM Alfonso ORN wrote: Please contact spouse Crystal Cliff to let know the status of the refill medication at [phone] >> Dec 28, 2023  9:50 AM Alfonso ORN wrote: Patient spouse Crystal Isbell calling on the status of the medication refill for the buprenorphine -naloxone  (SUBOXONE ) 8-2 mg SUBL SL tablet . Crystal call the pharmacy this morning and they do not have the medication refill yet

## 2023-12-29 ENCOUNTER — Encounter: Payer: Self-pay | Admitting: Student

## 2023-12-29 ENCOUNTER — Other Ambulatory Visit: Payer: Self-pay | Admitting: Student

## 2023-12-29 DIAGNOSIS — F1190 Opioid use, unspecified, uncomplicated: Secondary | ICD-10-CM

## 2023-12-29 MED ORDER — BUPRENORPHINE HCL-NALOXONE HCL 8-2 MG SL SUBL
1.0000 | SUBLINGUAL_TABLET | Freq: Three times a day (TID) | SUBLINGUAL | 0 refills | Status: DC
Start: 2023-12-29 — End: 2024-03-07

## 2023-12-29 NOTE — Progress Notes (Signed)
 Received call on on-call pager about patients Suboxone  prescription. [facility] was [facility] a [facility] where unfortunately the DEA number was not approved. I have cancelled that prescription and have sent it to [facility] in [place] ([address]), and they have confirmed that the prescription went through.
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
## 2024-01-01 NOTE — Progress Notes (Signed)
 Internal Medicine Clinic Attending  Case discussed with the resident at the time of the visit.  We reviewed the resident's history and exam and pertinent patient test results.  I agree with the assessment, diagnosis, and plan of care documented in the resident's note.

## 2024-01-04 ENCOUNTER — Ambulatory Visit (INDEPENDENT_AMBULATORY_CARE_PROVIDER_SITE_OTHER): Admitting: Student

## 2024-01-04 DIAGNOSIS — F1190 Opioid use, unspecified, uncomplicated: Secondary | ICD-10-CM

## 2024-01-04 NOTE — Progress Notes (Signed)
 Called patient x4 between 9:45 a - 10 AM without success. Unable to leave VM. Patient already has an in-person follow up appointment with Dr. Myrna on 01/18/2024

## 2024-01-15 ENCOUNTER — Institutional Professional Consult (permissible substitution): Payer: Self-pay | Admitting: Licensed Clinical Social Worker

## 2024-01-18 ENCOUNTER — Ambulatory Visit: Payer: Self-pay | Admitting: Licensed Clinical Social Worker

## 2024-01-18 ENCOUNTER — Telehealth (INDEPENDENT_AMBULATORY_CARE_PROVIDER_SITE_OTHER): Payer: Self-pay | Admitting: Licensed Clinical Social Worker

## 2024-01-18 ENCOUNTER — Encounter

## 2024-01-18 DIAGNOSIS — F419 Anxiety disorder, unspecified: Secondary | ICD-10-CM

## 2024-01-18 DIAGNOSIS — F32A Depression, unspecified: Secondary | ICD-10-CM | POA: Diagnosis not present

## 2024-01-18 NOTE — BH Specialist Note (Signed)
 Integrated Behavioral Health Initial In-Person Visit  MRN: [id number] Name: Edward [name]  Number of Integrated Behavioral Health Clinician visits: No data recorded Session Start time: 0915    Session End time: 0945  Total time in minutes: 30    Types of Service: Introduction only  Interpretor:No. Interpretor Name and Language: N/A   Subjective: Edward [name] is a 53 y.o. male   Patient was referred by PCP for Counseling.  Patient reports the following symptoms/concerns: The Licensed Clinical Engineer, building services (LCSW-A), acting as a Economist [facility]), initiated a session with patient. The [facility] introduced themselves, explained her role, and provided contact information to the patient. Confidentiality and mandated reporting were discussed, and the patient denied any suicidal ideations or intent to harm others. The Integrated Behavioral Health (IBH) approach was reviewed, and a PHQ-9 assessment was completed.   Duration of problem: More than two years; Severity of problem: moderate  Objective: Mood: NA and Affect: Appropriate Risk of harm to self or others: No plan to harm self or others  Life Context: Family and Social: Will Discuss next session School/Work: Will Discuss next session Self-Care: Will Discuss next session Life Changes: Will Discuss next session  Patient and/or Family's Strengths/Protective Factors: Sense of purpose  Goals Addressed: Patient will: Reduce symptoms of: anxiety   Progress towards Goals: Ongoing  Interventions: Interventions utilized: CBT Cognitive Behavioral Therapy and Supportive Counseling  Standardized Assessments completed: Patient declined screening     Patient and/or Family Response: Patient agrees to services.  Patient Centered Plan: Patient is on the following Treatment Plan(s):  Will Discuss next session  Clinical Assessment/Diagnosis  Anxiety and depression   Assessment: Patient currently  experiencing Anxiety and Depression.   Patient may benefit from Individual OPT.  Plan: Follow up with behavioral health clinician on : 10/14  Renda Pontes, MSW, LCSW-A She/Her Behavioral Health Clinician [facility]  Internal Medicine Center

## 2024-01-18 NOTE — Telephone Encounter (Signed)
 [facility] attempted patient on today. Phone went straight to VM. [facility] left a message. [facility] will attempt another contact by end of day.  Renda Pontes, MSW, LCSW-A She/Her Behavioral Health Clinician [facility]  Internal Medicine Center

## 2024-01-30 ENCOUNTER — Telehealth: Payer: Self-pay | Admitting: Licensed Clinical Social Worker

## 2024-01-30 ENCOUNTER — Ambulatory Visit: Payer: Self-pay | Admitting: Licensed Clinical Social Worker

## 2024-01-30 ENCOUNTER — Telehealth: Payer: Self-pay

## 2024-01-30 DIAGNOSIS — F419 Anxiety disorder, unspecified: Secondary | ICD-10-CM

## 2024-01-30 DIAGNOSIS — F32A Depression, unspecified: Secondary | ICD-10-CM

## 2024-01-30 NOTE — Telephone Encounter (Signed)
 I called pt back to setup his appt, but no answer. VM is not setup, and unable to leave message.

## 2024-01-30 NOTE — Telephone Encounter (Signed)
 [facility] attempted patient multiple times. Patient phone continues to say  Patient can not receive calls at this time  Patient will need to contact agency and update phone number if number has changed.   Renda Pontes, MSW, LCSW-A She/Her Behavioral Health Clinician [facility]  Internal Medicine Center

## 2024-01-30 NOTE — Telephone Encounter (Signed)
 Copied from CRM [id number]. Topic: Appointments - Scheduling Inquiry for Clinic >> Jan 26, 2024 10:04 AM Debby BROCKS wrote: Reason for CRM: Patient would like to schedule a med management appointment to speak about his medicine. However only in person appointments populate. Patient cannot go in person due to ride issues and needs it to be virtual

## 2024-02-02 ENCOUNTER — Ambulatory Visit: Payer: Self-pay

## 2024-02-02 NOTE — Telephone Encounter (Signed)
 Patient reports being bit by an unknown insect two days ago. Patient endorses moderate to severe swelling to left leg. Patient denies CP, SOB or fever. Patient does state the swelling involves half of his leg. Patient is recommended to Urgent Care or ED for evaluation. Patient verbalized understanding.   FYI Only or Action Required?: FYI only for provider.  Patient was last seen in primary care on 01/04/2024 by Elnora Ip, MD.  Called Nurse Triage reporting Insect Bite.  Symptoms began 2 days ago.  Interventions attempted: Rest, hydration, or home remedies.  Symptoms are: gradually worsening.  Triage Disposition: See HCP Within 4 Hours (Or PCP Triage)  Patient/caregiver understands and will follow disposition?: Yes  Copied from CRM [id number]. Topic: Clinical - Red Word Triage >> Feb 02, 2024  2:50 PM DeAngela L wrote: Red Word that prompted transfer to Nurse Triage: Patients wife Crystal calling cause she believes the patient was bit by a spider on the left leg and swollen purple has a hole in it and with little white marks  The patient is on speaker phone   Pt num [phone] (H) >> Feb 02, 2024  2:54 PM DeAngela L wrote: This has been 2 days since he was bit  Reason for Disposition  [1] Red streak or red line AND [2] length > 2 inches (5 cm)  Answer Assessment - Initial Assessment Questions 1. TYPE of INSECT: What type of insect was it?      Unsure what type of insect 2. ONSET: When did you get bitten?      Occurred two days ago 3. LOCATION: Where is the insect bite located?      Left leg 4. REDNESS: Is the area red or pink? If Yes, ask: What size is the area of redness? (inches or cm). When did the redness start?     Area of redness is the size of dollar. Redness was noticed this morning around 0400 5. PAIN: Is there any pain? If Yes, ask: How bad is the pain? (Scale 0-10; or none, mild, moderate, severe)     6 out of 10 6. ITCHING: Does it  itch? If Yes, ask: How bad is the itch?      Yes-itched last night but no itching currently 7. SWELLING: How big is the swelling? (e.g., inches, cm, or compare to coins)     Swelling to leg.  8. OTHER SYMPTOMS: Do you have any other symptoms?  (e.g., difficulty breathing, fever, hives)     no  Protocols used: Insect Bite-A-AH

## 2024-02-05 NOTE — Telephone Encounter (Signed)
 Called cell# - call cannot be completed; called home # - no answer, left message of office's return call.

## 2024-02-19 ENCOUNTER — Encounter: Payer: Self-pay | Admitting: Radiology

## 2024-02-21 ENCOUNTER — Telehealth: Admitting: Student

## 2024-02-21 NOTE — Progress Notes (Deleted)
 Video visit was not completed due to an unstable internet connection on the patient's side. Attempted to contact the patient by phone, but was unable to hear him. Suspect ongoing network issues on the patient's end.

## 2024-02-21 NOTE — Progress Notes (Deleted)
   CC: ***  This is a telephone encounter between [employer] and Edward [name] on 02/21/2024 for ***. The visit was conducted with the patient located at {NAMES:[id number]::home} and Missy Sandhoff at [employer]. The patient's identity was confirmed using their DOB and current address. The {WHO:[id number]::patient,his/her legal guardian,***} has consented to being evaluated through a telephone encounter and understands the associated risks (an examination cannot be done and the patient may need to come in for an appointment) / benefits (allows the patient to remain at home, decreasing exposure to coronavirus). I personally spent {Numbers; [phone]} minutes on medical discussion.   HPI:  [name] is a 53 y.o. with PMH as below.   Please see A&P for assessment of the patient's acute and chronic medical conditions.   Past Medical History:  Diagnosis Date   Bronchitis    GERD (gastroesophageal reflux disease)    Review of Systems:  ***    Assessment & Plan:   No problem-specific Assessment & Plan notes found for this encounter.    Patient {GC/GE:[id number]::discussed with,seen with} Dr. {WJFZD:[id number]::Fjryzw,Ojl,Z. Hoffman,Williams,Mullen,Narendra,Guilloud,Vincent}  Missy Sandhoff, MD  Internal Medicine Resident

## 2024-03-05 NOTE — BH Specialist Note (Signed)
 Patient no-showed today's appointment; appointment was for Telephone visit at 1:30Pm on 10/14.  Patient will need to reschedule appointment by calling Internal medicine center [phone].  Renda Pontes, MSW, LCSW-A She/Her Behavioral Health Clinician [facility]  Internal Medicine Center

## 2024-03-06 ENCOUNTER — Telehealth: Payer: Self-pay

## 2024-03-06 DIAGNOSIS — F1190 Opioid use, unspecified, uncomplicated: Secondary | ICD-10-CM

## 2024-03-06 NOTE — Telephone Encounter (Unsigned)
 Copied from CRM [id number]. Topic: Clinical - Medication Refill >> Mar 06, 2024 11:41 AM Suzette B wrote: Medication:  buprenorphine -naloxone  (SUBOXONE ) 8-2 mg SUBL SL tablet gabapentin  (NEURONTIN ) 300 MG capsule  Has the patient contacted their pharmacy? No    [id number] [place], [place] - [address] [place] [address] Phone: [phone] Fax: [phone]  Is this the correct pharmacy for this prescription? Yes If no, delete pharmacy and type the correct one.   Has the prescription been filled recently? Yes  Is the patient out of the medication? Yes  Has the patient been seen for an appointment in the last year OR does the patient have an upcoming appointment? Yes  Can we respond through MyChart? Yes  Agent: Please be advised that Rx refills may take up to 3 business days. We ask that you follow-up with your pharmacy.

## 2024-03-08 ENCOUNTER — Other Ambulatory Visit: Payer: Self-pay | Admitting: Student

## 2024-03-08 MED ORDER — BUPRENORPHINE HCL-NALOXONE HCL 8-2 MG SL SUBL: 1.0000 | SUBLINGUAL_TABLET | Freq: Three times a day (TID) | SUBLINGUAL | 0 refills | Status: DC

## 2024-03-08 NOTE — Telephone Encounter (Signed)
 Reviewed PDMP for Dr. Napoleon who is not able to access the database prior to refilling controlled substance.

## 2024-03-08 NOTE — Telephone Encounter (Signed)
 PDMP reviewed. Prescription appropriate

## 2024-03-11 ENCOUNTER — Telehealth (INDEPENDENT_AMBULATORY_CARE_PROVIDER_SITE_OTHER): Payer: Self-pay | Admitting: Student

## 2024-03-11 DIAGNOSIS — M5410 Radiculopathy, site unspecified: Secondary | ICD-10-CM

## 2024-03-11 DIAGNOSIS — F1190 Opioid use, unspecified, uncomplicated: Secondary | ICD-10-CM

## 2024-03-11 MED ORDER — BUPRENORPHINE HCL-NALOXONE HCL 8-2 MG SL SUBL: 1.0000 | SUBLINGUAL_TABLET | Freq: Three times a day (TID) | SUBLINGUAL | 0 refills | Status: DC

## 2024-03-11 NOTE — Patient Instructions (Signed)
 Return to the clinic in 30 days for an in-person evaluation and refill of Suboxone .  Remember to bring all of the medications that you take (including over the counter medications and supplements) with you to every clinic visit.  This after visit summary is an important review of tests, referrals, and medication changes that were discussed during your visit. If you have questions or concerns, call [phone]. Outside of clinic business hours, call the main hospital at [phone] and ask the operator for the on-call internal medicine resident.   Ozell Kung MD 03/11/2024, 3:28 PM

## 2024-03-11 NOTE — Assessment & Plan Note (Signed)
 Chronic, stable. Hasn't used illicit opioids for several years. Suboxone  also helps with his chronic pain due to hip and back problems. Will refill for 30 day supply today. He'll follow-up for in-person visit and tox-assure around 04/06/24.

## 2024-03-11 NOTE — Progress Notes (Signed)
  Patient name: Edward [name] Date of birth: 1970-09-13 Date of visit: 03/11/24  This visit was conducted via a synchronous, secure telehealth platform using real-time audio/video communication.   The patient was located in [place] at home, and I was in the Internal Medicine Center at the time of the visit.  Patient identity and location were verified at the start of the encounter. Verbal consent was obtained for the telehealth visit, including discussion of the risks, benefits, and limitations of remote care.   This encounter was conducted in accordance with applicable federal and state telehealth laws and payer-specific guidelines.  I spent 15 minutes on the phone with the patient.  Subjective   Reason for visit: suboxone  refill.  Mr. Allaire is doing well with respect to substance use. He is several years abstinent from illicit and non-prescription opioids. He requests suboxone  refill for opioid use disorder and chronic pain due to hip and knee problems.  Review of chart is notable for the presence of methamphetamine metabolites in the urine on last few toxicology reports. Expected presence of buprenorphine  is confirmed.   Current Outpatient Medications  Medication Instructions   amLODipine  (NORVASC ) 5 mg, Oral, Daily   buprenorphine -naloxone  (SUBOXONE ) 8-2 mg SUBL SL tablet 1 tablet, Sublingual, 3 times daily   diclofenac  Sodium (VOLTAREN ) 4 g, Topical, 4 times daily   escitalopram  (LEXAPRO ) 20 mg, Oral, Daily   gabapentin  (NEURONTIN ) 300 mg, Oral, 4 times daily   lidocaine  (LIDODERM ) 5 % 1 patch, Transdermal, Every 24 hours, Remove & Discard patch within 12 hours or as directed by MD   methocarbamol  (ROBAXIN ) 500 mg, Oral, 2 times daily PRN   metoCLOPramide  (REGLAN ) 5 mg, Oral, Every 6 hours PRN, for nausea   naproxen  (NAPROSYN ) 500 mg, Oral, 2 times daily with meals   ondansetron  (ZOFRAN ) 4 mg, Oral, Every 8 hours PRN   pantoprazole  (PROTONIX ) 40 mg, Oral, 2 times daily before  meals   promethazine  (PHENERGAN ) 12.5 mg, Rectal, Every 6 hours PRN   promethazine  (PHENERGAN ) 12.5 mg, Oral, Every 6 hours PRN   SUMAtriptan  (IMITREX ) 20 mg, Nasal, Every 2 hours PRN, To be administered during the prodrome or shortly after vomiting begins (eg, within 30 to 45 minutes).May repeat in 2 hours if vomiting persists or recurs. No More than 6 doses / week.     Assessment & Plan   Opioid use disorder Assessment & Plan: Chronic, stable. Hasn't used illicit opioids for several years. Suboxone  also helps with his chronic pain due to hip and back problems. Will refill for 30 day supply today. He'll follow-up for in-person visit and tox-assure around 04/06/24.  Orders: -     Buprenorphine  HCl-Naloxone  HCl; Place 1 tablet under the tongue 3 (three) times daily.  Dispense: 90 tablet; Refill: 0  Radicular pain of left lower extremity -     Buprenorphine  HCl-Naloxone  HCl; Place 1 tablet under the tongue 3 (three) times daily.  Dispense: 90 tablet; Refill: 0  Chronic, continuous use of opioids -     ToxAssure Select,+Antidepr,UR; Future    No follow-ups on file.  Ozell Kung MD 03/11/2024, 3:28 PM

## 2024-03-12 NOTE — Addendum Note (Signed)
 Addended by: NORRINE SHARPER on: 03/12/2024 05:25 PM   Modules accepted: Level of Service

## 2024-03-16 ENCOUNTER — Other Ambulatory Visit: Payer: Self-pay

## 2024-03-16 DIAGNOSIS — F32A Depression, unspecified: Secondary | ICD-10-CM

## 2024-03-16 DIAGNOSIS — M5410 Radiculopathy, site unspecified: Secondary | ICD-10-CM

## 2024-03-16 DIAGNOSIS — F1190 Opioid use, unspecified, uncomplicated: Secondary | ICD-10-CM

## 2024-03-16 MED ORDER — BUPRENORPHINE HCL-NALOXONE HCL 8-2 MG SL SUBL: 1.0000 | SUBLINGUAL_TABLET | Freq: Three times a day (TID) | SUBLINGUAL | 0 refills | Status: AC

## 2024-03-22 NOTE — Progress Notes (Unsigned)
 A visit could not be completed due to the patient reporting symptoms concerning for unstable angina. I attempted to convert the encounter to a phone visit; however, the audio quality was poor, and I was unable to adequately hear or assess the patient.   The incomplete notes from that encounter were appropriately deleted.

## 2024-03-25 NOTE — Progress Notes (Signed)
 Internal Medicine Clinic Attending  Case discussed with the resident at the time of the visit.  We reviewed the resident's history and exam and pertinent patient test results.  I agree with the assessment, diagnosis, and plan of care documented in the resident's note.

## 2024-03-27 ENCOUNTER — Other Ambulatory Visit: Payer: Self-pay

## 2024-03-27 ENCOUNTER — Ambulatory Visit: Admitting: Surgical

## 2024-03-27 DIAGNOSIS — M25562 Pain in left knee: Secondary | ICD-10-CM

## 2024-03-27 DIAGNOSIS — M8700 Idiopathic aseptic necrosis of unspecified bone: Secondary | ICD-10-CM | POA: Diagnosis not present

## 2024-03-27 DIAGNOSIS — M545 Low back pain, unspecified: Secondary | ICD-10-CM | POA: Diagnosis not present

## 2024-03-27 DIAGNOSIS — M25561 Pain in right knee: Secondary | ICD-10-CM

## 2024-03-27 DIAGNOSIS — G8929 Other chronic pain: Secondary | ICD-10-CM | POA: Diagnosis not present

## 2024-03-27 DIAGNOSIS — M1711 Unilateral primary osteoarthritis, right knee: Secondary | ICD-10-CM | POA: Diagnosis not present

## 2024-04-07 MED ORDER — METHYLPREDNISOLONE ACETATE 40 MG/ML IJ SUSP
40.0000 mg | INTRAMUSCULAR | Status: AC | PRN
  Administered 2024-03-27: 40 mg via INTRA_ARTICULAR

## 2024-04-07 MED ORDER — LIDOCAINE HCL 1 % IJ SOLN
5.0000 mL | INTRAMUSCULAR | Status: AC | PRN
  Administered 2024-03-27: 5 mL

## 2024-04-07 MED ORDER — BUPIVACAINE HCL 0.25 % IJ SOLN
4.0000 mL | INTRAMUSCULAR | Status: AC | PRN
  Administered 2024-03-27: 4 mL via INTRA_ARTICULAR

## 2024-04-07 NOTE — Progress Notes (Signed)
 "  Office Visit Note   Patient: Edward [name]           Date of Birth: 1970/07/29           MRN: [id number] Visit Date: 03/27/2024 Requested by: Napoleon Limes, MD [address] [place],  [place] [address] PCP: Napoleon Limes, MD  Subjective: Chief Complaint  Patient presents with   Knee Pain    Right - been hurting for 3 weeks no  injury has swelling and a knot was told by Dr. Barbarann the he has AVN of bilateral hips so not sure if pain is coming from altered gait. Has been wearing a brace taking suboxone  gabapentin  and ibuprofen with minimal relief. Also wants Left knee checked out haas a knot that come up about  year ago and has gotten larger denies pain    HPI: Edward [name] is a 53 y.o. male who presents to the office reporting multiple joint complaints.  Patient states that he is here primarily because he has had right knee pain over the last 3 weeks.  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  He had THA scheduled in the past but ultimately canceled this.  He denies any recent injury.  Left hip bothers him less than the right hip.  Uses a knee brace.  He is on Suboxone , gabapentin , ibuprofen.  He states that he has buckling from the right knee.  This is progressively worsening over the last 3 weeks.  He has radiation from the knee down to the ankle.  Does have chronic low back pain with a diagnosis of degenerative disc disease.  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  Denies any history of surgery to the right knee.  No mechanical symptoms in the right knee.  He does note it has been very swollen recently..                ROS: All systems reviewed are negative as they relate to the chief complaint within the history of present illness.  Patient denies fevers or chills.  Assessment & Plan: Visit Diagnoses:  1. Acute pain of right knee   2. Acute pain of left knee   3. Avascular  necrosis (HCC)   4. Chronic bilateral low back pain without sciatica     Plan: Impression is right knee with moderate effusion.  He has mild to moderate joint space narrowing of the right knee with flabella on lateral view but also what appears to be a possible loose body in the posterior aspect of the right knee.  No mechanical symptoms however.  We did discuss options available patient.  He would like to try aspiration and injection.  40 cc of nonpurulent synovial fluid was aspirated from the right knee and cortisone injection administered.  We will see how this affects his pain and if the vast majority of his pain improves, we will see how long the injection lasts.  If no substantial improvement for any amount of time or very limited improvement, next up would be either diagnostic hip injection or MRI of the right knee for further evaluation.  Recommended patient send MyChart message or call us  in 1 to 2 weeks to let us  know how he is doing.  Follow-Up Instructions: No follow-ups on file.   Orders:  Orders Placed This Encounter  Procedures   DG Knee AP/LAT W/Sunrise Right   DG  Knee AP/LAT W/Sunrise Left   DG HIP UNILAT WITH PELVIS 2-3 VIEWS RIGHT   DG Lumbar Spine 2-3 Views   No orders of the defined types were placed in this encounter.     Procedures: Large Joint Inj: R knee on 03/27/2024 1:14 PM Indications: diagnostic evaluation, joint swelling and pain Details: 18 G 1.5 in needle, superolateral approach  Arthrogram: No  Medications: 5 mL lidocaine  1 %; 4 mL bupivacaine  0.25 %; 40 mg methylPREDNISolone  acetate 40 MG/ML Aspirate: 40 mL Outcome: tolerated well, no immediate complications Procedure, treatment alternatives, risks and benefits explained, specific risks discussed. Consent was given by the patient. Immediately prior to procedure a time out was called to verify the correct patient, procedure, equipment, support staff and site/side marked as required. Patient was prepped  and draped in the usual sterile fashion.       Clinical Data: No additional findings.  Objective: Vital Signs: There were no vitals taken for this visit.  Physical Exam:  Constitutional: Patient appears well-developed HEENT:  Head: Normocephalic Eyes:EOM are normal Neck: Normal range of motion Cardiovascular: Normal rate Pulmonary/chest: Effort normal Neurologic: Patient is alert Skin: Skin is warm Psychiatric: Patient has normal mood and affect  Ortho Exam: Ortho exam demonstrates right knee with moderate effusion.  He has tenderness over the medial joint line but not the lateral joint line.  No abnormality noted on inspection.  No cellulitis or skin changes.  No abnormal warmth compared with contralateral side.  No calf tenderness.  Negative Homans' sign.  Palpable DP pulse.  Able to perform straight leg raise without extensor lag.  Stable to anterior posterior drawer sign.  Stable to varus and valgus stress at 0 and 30 degrees.  Really minimal pain reproduced with hip range of motion despite his diagnosis of AVN.  He is able to perform active hip flexion with minimal pain.  Specialty Comments:  No specialty comments available.  Imaging: No results found.   PMFS History: Patient Active Problem List   Diagnosis Date Noted   Osteoarthritis of hands, bilateral 10/31/2023   Generalized abdominal pain 11/15/2022   Loss of weight 06/22/2022   Hx of colonic polyps 03/26/2022   Colon cancer screening 03/07/2022   Lipoma 02/18/2022   Hypertension 02/18/2022   Radicular pain of left lower extremity 01/20/2022   Healthcare maintenance 01/20/2022   Cannabinoid hyperemesis syndrome 10/06/2021   Abdominal pain, chronic, epigastric 10/06/2021   GERD (gastroesophageal reflux disease) 10/06/2021   Avascular necrosis of femoral head (HCC) 06/15/2021   Opioid use disorder 05/19/2021   Chronic lower back pain 05/19/2021   Anxiety and depression 05/19/2021   Past Medical History:   Diagnosis Date   Bronchitis    GERD (gastroesophageal reflux disease)     Family History  Problem Relation Age of Onset   Diabetes Mother    Heart disease Father    Prostate cancer Father     Past Surgical History:  Procedure Laterality Date   BIOPSY  09/20/2021   Procedure: BIOPSY;  Surgeon: Cindie Carlin POUR, DO;  [facility];  Service: Endoscopy;;   COLONOSCOPY WITH PROPOFOL  N/A 03/21/2022   Procedure: COLONOSCOPY WITH PROPOFOL ;  Surgeon: Cindie Carlin POUR, DO;  [facility]: [facility];  Service: Endoscopy;  Laterality: N/A;  2:00 pm   ESOPHAGOGASTRODUODENOSCOPY (EGD) WITH PROPOFOL  N/A 09/20/2021   Procedure: ESOPHAGOGASTRODUODENOSCOPY (EGD) WITH PROPOFOL ;  Surgeon: Cindie Carlin POUR, DO;  [facility]: [facility];  Service: Endoscopy;  Laterality: N/A;  12:15pm   POLYPECTOMY  03/21/2022   Procedure: POLYPECTOMY;  Surgeon: Cindie Carlin POUR, DO;  [facility]: [facility];  Service: Endoscopy;;   Social History   Occupational History   Not on file  Tobacco Use   Smoking status: Never    Passive exposure: Current   Smokeless tobacco: Never  Vaping Use   Vaping status: Never Used  Substance and Sexual Activity   Alcohol use: Not Currently   Drug use: Yes    Types: Marijuana   Sexual activity: Yes        "

## 2024-04-09 ENCOUNTER — Other Ambulatory Visit: Payer: Self-pay

## 2024-04-09 DIAGNOSIS — F419 Anxiety disorder, unspecified: Secondary | ICD-10-CM

## 2024-04-09 MED ORDER — ESCITALOPRAM OXALATE 10 MG PO TABS: 10.0000 mg | ORAL_TABLET | Freq: Every day | ORAL | 1 refills | Status: AC

## 2024-04-18 ENCOUNTER — Encounter: Payer: Self-pay | Admitting: Gastroenterology

## 2024-04-26 ENCOUNTER — Ambulatory Visit: Payer: Self-pay

## 2024-05-03 ENCOUNTER — Ambulatory Visit: Payer: Self-pay | Admitting: Student

## 2024-05-03 NOTE — Progress Notes (Unsigned)
" ° °  Established Patient Office Visit  Subjective   Patient ID: Edward [name], male    DOB: 1970/05/12  Age: 54 y.o. MRN: [id number]  No chief complaint on file.   Edward [name] is a 54 y.o. male with past medical history of hypertension, cannabinoid hyperemesis syndrome, GERD, avascular necrosis of femoral head, osteoarthritis of bilateral hands, OUD, anxiety and depression presents today after he had arthrocentesis of the right knee with 40 mL aspirate with orthopedics on 03/27/2024.  Review of Systems:  As per assessment and Plan   Objective:     There were no vitals filed for this visit. ***  Physical Exam General: Sitting in chair, no acute distress Cardiovascular: Regular rate Pulmonary: Breathing comfortably Abdomen: Soft, nontender, nondistended MSK: No lower extremity edema bilaterally  {Labs (Optional):23779}  The ASCVD Risk score (Arnett DK, et al., 2019) failed to calculate for the following reasons:   Cannot find a previous HDL lab   Cannot find a previous total cholesterol lab   * - Cholesterol units were assumed    Assessment & Plan:   Patient {GC/GE:3044014::discussed with,seen with} Dr. {NAMES:[id number]::Chambliss,Chun,Hoffman,Lau,Machen,Narendra,Williams,Winfrey}  Arthritis of right knee - Seen orthopedics on 03/27/2024.  They noted moderate effusion in the right knee and mild to moderate joint space narrowing; he had aspiration and injection in the joint space; they noted 40 cc of nonpurulent synovial fluid aspirated from the right knee and he received cortisone injection.  Patient was advised to follow-up in 1 to 2 weeks about his symptoms, if there were no improvement, the next plan was either diagnostic hip injection or MRI of the right knee for further evaluation.   OUD  - Suboxone   - Last Tox assure  - PDMP  - UDS today   Problem List Items Addressed This Visit   None   No follow-ups on file.    Edward Edwards, DO "

## 2024-05-16 ENCOUNTER — Ambulatory Visit: Admitting: Student

## 2024-05-16 NOTE — Progress Notes (Unsigned)
" ° °  Established Patient Office Visit  Subjective   Patient ID: Edward [name], male    DOB: 01-05-1971  Age: 54 y.o. MRN: [id number]  No chief complaint on file.   Edward [name] is a 54 y.o. who presents to the clinic for ***. Please see problem based assessment and plan for additional details.   Right knee pain: Patient saw orthopedics in December 2025 for acute pain in the right knee.  During this appointment, he had a moderate effusion present of the right knee and he underwent an aspiration with steroid injection.   OUD Patient presents with a history of opoid use disorder on suboxone  8-2 mg TID. Patient*** adverse effects including nausea, vomiting, constipation. They *** relapses or cravings.  Plan: -Contract UTD -Toxassure ordered, prior toxassure was *** and was ***  -Bowel regimen:    Patient Active Problem List   Diagnosis Date Noted   Osteoarthritis of hands, bilateral 10/31/2023   Generalized abdominal pain 11/15/2022   Loss of weight 06/22/2022   Hx of colonic polyps 03/26/2022   Colon cancer screening 03/07/2022   Lipoma 02/18/2022   Hypertension 02/18/2022   Radicular pain of left lower extremity 01/20/2022   Healthcare maintenance 01/20/2022   Cannabinoid hyperemesis syndrome 10/06/2021   Abdominal pain, chronic, epigastric 10/06/2021   GERD (gastroesophageal reflux disease) 10/06/2021   Avascular necrosis of femoral head (HCC) 06/15/2021   Opioid use disorder 05/19/2021   Chronic lower back pain 05/19/2021   Anxiety and depression 05/19/2021       Objective:     There were no vitals taken for this visit. BP Readings from Last 3 Encounters:  12/27/23 137/89  12/21/23 (!) 148/89  10/31/23 116/84   Wt Readings from Last 3 Encounters:  12/27/23 212 lb 6.4 oz (96.3 kg)  12/21/23 210 lb (95.3 kg)  10/31/23 206 lb 6.4 oz (93.6 kg)      Physical Exam   No results found for any visits on 05/16/24.  Last metabolic panel Lab Results  Component  Value Date   GLUCOSE 122 (H) 09/04/2023   NA 137 09/04/2023   K 3.6 09/04/2023   CL 98 09/04/2023   CO2 26 09/04/2023   BUN 12 09/04/2023   CREATININE 0.90 09/04/2023   GFRNONAA >60 09/04/2023   CALCIUM 9.0 09/04/2023   PROT 6.8 09/04/2023   ALBUMIN 3.5 09/04/2023   LABGLOB 2.6 05/18/2021   AGRATIO 1.7 05/18/2021   BILITOT 0.9 09/04/2023   ALKPHOS 93 09/04/2023   AST 23 09/04/2023   ALT 19 09/04/2023   ANIONGAP 7 09/04/2023   Last lipids No results found for: CHOL, HDL, LDLCALC, LDLDIRECT, TRIG, CHOLHDL Last hemoglobin A1c Lab Results  Component Value Date   HGBA1C 5.1 06/07/2022      The ASCVD Risk score (Arnett DK, et al., 2019) failed to calculate for the following reasons:   Cannot find a previous HDL lab   Cannot find a previous total cholesterol lab   * - Cholesterol units were assumed    Assessment & Plan:   Problem List Items Addressed This Visit   None   No follow-ups on file.    Damien Lease, DO  "

## 2024-05-24 ENCOUNTER — Ambulatory Visit: Payer: Self-pay | Admitting: Student

## 2024-05-24 NOTE — Progress Notes (Unsigned)
 "  CC: ***  HPI:  [name] is a 54 y.o.  male with past medical history of OUD, hypertension, anxiety/depression, GERD who presents for medication management.  Please see assessment and plan for full HPI.  Medications: Headaches: Sumatriptan  nasal spray 2 mg every 2 hours. Anxiety/depression: Lexapro  10 mg daily Hypertension: Amlodipine  5 mg daily Nausea/vomiting: Promethazine  12.5 mg every 6 hours as needed, Reglan  5 mg every 6 hours as needed, Zofran  4 mg every 8 hours. OUD: Suboxone  1 tablet 3 times daily 8-2 mg Arthritis: Voltaren  gel, naproxen  500 mg twice daily, lidocaine  patch, Robaxin  500 mg twice daily as needed Neuropathy: Gabapentin  300 mg 4 times daily  Last seen in the office on 03/07/2024.  This was a telehealth visit.  Patient refilled Suboxone .  Had concerns of left lower radicular pain.  Seen by orthopedics on 03/27/2024: Had aspiration of right knee.  And given cortisone injection.  Most recent labs:  12/21/23: Tox assure: THC, amphetamine, Suboxone   09/04/23: CMP K 3.4, na 134   Past Medical History:  Diagnosis Date   Bronchitis    GERD (gastroesophageal reflux disease)     Current Medications[1]  Review of Systems:  ***  Constitutional: Eye: Respiratory: Cardiovascular: GI: MSK: GU: Skin: Neuro: Endocrine:   Physical Exam:  There were no vitals filed for this visit. *** General: Patient is sitting comfortably in the room  Eyes: Pupils equal and reactive to light, EOM intact  Head: Normocephalic, atraumatic  Neck: Supple, nontender, full range of motion, No JVD Cardio: Regular rate and rhythm, no murmurs, rubs or gallops. 2+ pulses to bilateral upper and lower extremities  Chest: No chest tenderness Pulmonary: Clear to ausculation bilaterally with no rales, rhonchi, and crackles  Abdomen: Soft, nontender with normoactive bowel sounds with no rebound or guarding  Neuro: Alert and orientated x3. CN II-XII intact. Sensation intact  to upper and lower extremities. 2+ patellar reflex.  Back: No midline tenderness, no step off or deformities noted. No paraspinal muscle tenderness.  Skin: No rashes noted  MSK: 5/5 strength to upper and lower extremities.    Assessment & Plan:   Assessment & Plan Opioid use disorder      Patient {GC/GE:3044014::discussed with,seen with} Dr. {WJFZD:[id number]::Rylw,Ynqqfjw,Ryjfaopdd,Fjryzw,Tpoopjfd,Tpwqmzb}  Libby Blanch, DO Internal Medicine Resident PGY-3     [1]  Current Outpatient Medications:    amLODipine  (NORVASC ) 5 MG tablet, Take 1 tablet (5 mg total) by mouth daily., Disp: 30 tablet, Rfl: 11   buprenorphine -naloxone  (SUBOXONE ) 8-2 mg SUBL SL tablet, Place 1 tablet under the tongue 3 (three) times daily., Disp: 90 tablet, Rfl: 0   diclofenac  Sodium (VOLTAREN ) 1 % GEL, Apply 4 g topically 4 (four) times daily., Disp: 4 g, Rfl: 3   escitalopram  (LEXAPRO ) 10 MG tablet, Take 1 tablet (10 mg total) by mouth daily., Disp: 180 tablet, Rfl: 1   gabapentin  (NEURONTIN ) 300 MG capsule, Take 1 capsule (300 mg total) by mouth 4 (four) times daily., Disp: 90 capsule, Rfl: 5   lidocaine  (LIDODERM ) 5 %, Place 1 patch onto the skin daily. Remove & Discard patch within 12 hours or as directed by MD, Disp: 30 patch, Rfl: 0   methocarbamol  (ROBAXIN ) 500 MG tablet, Take 1 tablet (500 mg total) by mouth 2 (two) times daily as needed for muscle spasms., Disp: 20 tablet, Rfl: 0   metoCLOPramide  (REGLAN ) 5 MG tablet, Take 1 tablet (5 mg total) by mouth every 6 (six) hours as needed. for nausea, Disp: 120 tablet, Rfl: 0  naproxen  (NAPROSYN ) 500 MG tablet, Take 1 tablet (500 mg total) by mouth 2 (two) times daily with a meal., Disp: 60 tablet, Rfl: 2   ondansetron  (ZOFRAN ) 4 MG tablet, TAKE 1 TABLET BY MOUTH EVERY 8 HOURS AS NEEDED, Disp: 60 tablet, Rfl: 1   pantoprazole  (PROTONIX ) 40 MG tablet, Take 1 tablet (40 mg total) by mouth 2 (two) times daily before a meal., Disp: 60 tablet, Rfl:  11   promethazine  (PHENERGAN ) 12.5 MG suppository, Place 1 suppository (12.5 mg total) rectally every 6 (six) hours as needed for nausea or vomiting., Disp: 12 each, Rfl: 0   promethazine  (PHENERGAN ) 12.5 MG tablet, Take 1 tablet (12.5 mg total) by mouth every 6 (six) hours as needed for nausea or vomiting., Disp: 30 tablet, Rfl: 2   SUMAtriptan  (IMITREX ) 20 MG/ACT nasal spray, Place 1 spray (20 mg total) into the nose every 2 (two) hours as needed for migraine or headache. To be administered during the prodrome or shortly after vomiting begins (eg, within 30 to 45 minutes).May repeat in 2 hours if vomiting persists or recurs. No More than 6 doses / week., Disp: 6 each, Rfl: 2  "
